# Patient Record
Sex: Female | Born: 1970
Health system: Southern US, Community
[De-identification: ages and names within clinical notes are randomized; demographics above are authoritative.]

## PROBLEM LIST (undated history)

## (undated) DIAGNOSIS — Z8709 Personal history of other diseases of the respiratory system: Secondary | ICD-10-CM

## (undated) DIAGNOSIS — F419 Anxiety disorder, unspecified: Secondary | ICD-10-CM

## (undated) DIAGNOSIS — N946 Dysmenorrhea, unspecified: Secondary | ICD-10-CM

## (undated) DIAGNOSIS — Z8759 Personal history of other complications of pregnancy, childbirth and the puerperium: Secondary | ICD-10-CM

## (undated) HISTORY — PX: BREAST BIOPSY: SHX20

## (undated) HISTORY — DX: Dysmenorrhea, unspecified: N94.6

## (undated) HISTORY — DX: Personal history of other complications of pregnancy, childbirth and the puerperium: Z87.59

## (undated) HISTORY — DX: Anxiety disorder, unspecified: F41.9

## (undated) HISTORY — PX: LIPOMA EXCISION: SHX5283

## (undated) HISTORY — DX: Personal history of other diseases of the respiratory system: Z87.09

---
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
## 1999-10-25 ENCOUNTER — Ambulatory Visit (HOSPITAL_COMMUNITY): Admission: RE | Admit: 1999-10-25 | Discharge: 1999-10-25 | Payer: Self-pay | Admitting: Gastroenterology

## 1999-10-25 ENCOUNTER — Encounter: Payer: Self-pay | Admitting: Gastroenterology

## 2011-09-06 DIAGNOSIS — F3281 Premenstrual dysphoric disorder: Secondary | ICD-10-CM | POA: Insufficient documentation

## 2013-03-17 DIAGNOSIS — Z8679 Personal history of other diseases of the circulatory system: Secondary | ICD-10-CM | POA: Insufficient documentation

## 2013-03-17 DIAGNOSIS — Z8759 Personal history of other complications of pregnancy, childbirth and the puerperium: Secondary | ICD-10-CM | POA: Insufficient documentation

## 2013-03-17 DIAGNOSIS — R002 Palpitations: Secondary | ICD-10-CM | POA: Insufficient documentation

## 2013-03-17 DIAGNOSIS — Z8709 Personal history of other diseases of the respiratory system: Secondary | ICD-10-CM | POA: Insufficient documentation

## 2017-03-29 DIAGNOSIS — Z01419 Encounter for gynecological examination (general) (routine) without abnormal findings: Secondary | ICD-10-CM | POA: Diagnosis not present

## 2017-03-29 DIAGNOSIS — Z1231 Encounter for screening mammogram for malignant neoplasm of breast: Secondary | ICD-10-CM | POA: Diagnosis not present

## 2017-04-17 MED FILL — FLUoxetine HCL 20 MG TABS: 20 | 90 days supply | Qty: 90 | Fill #0

## 2017-04-17 MED FILL — SPIRONOLACTONE 50 MG TAB: 50 | 90 days supply | Qty: 90 | Fill #0

## 2017-06-10 DIAGNOSIS — L700 Acne vulgaris: Secondary | ICD-10-CM | POA: Diagnosis not present

## 2017-06-10 DIAGNOSIS — L821 Other seborrheic keratosis: Secondary | ICD-10-CM | POA: Diagnosis not present

## 2017-06-17 DIAGNOSIS — H524 Presbyopia: Secondary | ICD-10-CM | POA: Diagnosis not present

## 2017-06-17 DIAGNOSIS — H5213 Myopia, bilateral: Secondary | ICD-10-CM | POA: Diagnosis not present

## 2017-06-26 MED FILL — TRETINOIN 0.1% CREAM: 0.1 | 20 days supply | Qty: 45 | Fill #0

## 2017-06-28 MED FILL — SPIRONOLACTONE 50 MG TAB: 50 | 90 days supply | Qty: 90 | Fill #0

## 2017-07-29 MED FILL — TRETINOIN 0.1% CREAM: 0.1 | 20 days supply | Qty: 45 | Fill #1

## 2017-08-26 MED FILL — FLUoxetine HCL 20 MG TABS: 20 | 90 days supply | Qty: 90 | Fill #1

## 2017-09-09 MED FILL — TRETINOIN 0.1% CREAM: 0.1 | 20 days supply | Qty: 45 | Fill #2

## 2017-10-21 MED FILL — TRETINOIN 0.1% CREAM: 0.1 | 20 days supply | Qty: 45 | Fill #3

## 2017-11-04 MED FILL — SPIRONOLACTONE 50 MG TABLET: 50 | 90 days supply | Qty: 90 | Fill #1

## 2017-12-10 ENCOUNTER — Ambulatory Visit (INDEPENDENT_AMBULATORY_CARE_PROVIDER_SITE_OTHER): Payer: 59 | Admitting: Family Medicine

## 2017-12-10 ENCOUNTER — Encounter: Payer: Self-pay | Admitting: Family Medicine

## 2017-12-10 VITALS — BP 104/70 | HR 73 | Temp 98.3°F | Resp 16 | Ht 65.5 in | Wt 135.0 lb

## 2017-12-10 DIAGNOSIS — R05 Cough: Secondary | ICD-10-CM

## 2017-12-10 DIAGNOSIS — R6889 Other general symptoms and signs: Secondary | ICD-10-CM | POA: Diagnosis not present

## 2017-12-10 DIAGNOSIS — J988 Other specified respiratory disorders: Secondary | ICD-10-CM

## 2017-12-10 DIAGNOSIS — R058 Other specified cough: Secondary | ICD-10-CM | POA: Insufficient documentation

## 2017-12-10 DIAGNOSIS — B9789 Other viral agents as the cause of diseases classified elsewhere: Secondary | ICD-10-CM

## 2017-12-10 HISTORY — DX: Other specified cough: R05.8

## 2017-12-10 HISTORY — DX: Other viral agents as the cause of diseases classified elsewhere: B97.89

## 2017-12-10 LAB — POCT INFLUENZA A/B
Influenza A, POC: NEGATIVE
Influenza B, POC: NEGATIVE

## 2017-12-10 MED ORDER — HYDROCODONE-HOMATROPINE 5-1.5 MG/5ML PO SYRP: ORAL_SOLUTION | ORAL | 0 refills | Status: DC

## 2017-12-10 MED ORDER — PREDNISONE 5 MG PO TABS: ORAL_TABLET | ORAL | 0 refills | Status: DC

## 2017-12-10 NOTE — Assessment & Plan Note (Signed)
Acute onset of cough, body aches and chills x 1 week, remains symptomatic, report of green sputum intermittently, no fever by history or on exam Hold on antibiotic, no indication currently, she will need data, as in sputum c/s, CXR and CBC prior to initiation of an antibiotic. We have discussed this and we agree to watch for an additional 48 hrs unless she deteriorates Sick leave x 1 or 2 days

## 2017-12-10 NOTE — Assessment & Plan Note (Addendum)
5 day course of predniosne 5 mg one twice daily, and hycodan 5 cc every 8 hrs as needed for excess cough, prinmarily to be used at bedtime, for sleep Encouraged to start daily claritin which she states she already has, also offer of steroid nasal spray /astelin to be sent to her long term pharmacy, no decision made on that during the visit

## 2017-12-10 NOTE — Progress Notes (Signed)
Katrina Morris     MRN: [id number]      DOB: [birth year]   HPI Katrina Morris / Dr. Mannie Katrina Morris is here for a sick visit. She presents as with a 7 day h/o becoming acutely ill. She  c/o generalized body aches, feels  like the truck ran all over her, malaise, poor appetite, fatigue. Has had chills , but no documented fever. States she  Was 100% well prior to this, and her usual energetic self. She Is concerned about having influenza and is relieved that she has tested  negative for influenza A and B in the office at the visit, she does not want  Tamiflu, and with a negative test, and no fever, I am comfortable with this Primary c/o sore throat, loss of voice and cough, at times chunks of yellow / green sputum, denies sinus pressure or  ear pain . Denies significant nasal congestion, sneezing  or allergy symptoms. Does report cough is worse at night and in early morning,states she does cough throughout the day as well, states she coughed so  much last night her spouse had to go in another room, sh is tired form excess coughing,   disturbing her sleep and feels too ill to work but at the same time , hates to miss work because of being ill, would ather be out to be on vacation! 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 Though no personal h/o of significant allergy symptoms, she does state that her children take claritin daily, and while speaking, she is intermittently unconsciously   sniffing mucus from the back of her nostrils which I am able to point out to her  No mention is made of any of her immediate family members being at home ill at this time She has been using multiple OTC medications for relief , decongestants , and cough suppressant ds for  past 5 days, pushing fluids , and trying to let time be the healer , but feels worse ather than better    ROS See HPI    PE  BP 104/70   Pulse 73   Temp 98.3 F (36.8 C) (Oral)   Resp 16   Ht 5' 5.5" (1.664 m)   Wt 135 lb (61.2 kg)   SpO2 100%   BMI 22.12 kg/m   Patient alert and oriented and in no cardiopulmonary distress.Ill appearing  HEENT: No facial asymmetry, EOMI,   oropharynx pink and moist.  Neck supple no JVD, no mass.no sinu tenderness, TM clear. Oropharynx pink and moist, no exudate, neck supple, no adenopathy  Chest: Clear to auscultation bilaterally.  CVS: S1, S2 no murmurs, no S3.Regular rate.  ABD: Soft non tender.   Ext: No edema  MS: Adequate ROM spine, shoulders, hips and knees.  Skin: Intact, no ulcerations or rash noted.  Psych: Good eye contact, normal affect. Memory intact not anxious or depressed appearing.  CNS: CN 2-12 intact, .no focal deficits noted.   Assessment & Plan  Viral respiratory illness Acute onset of cough, body aches and chills x 1 week, remains symptomatic, report of green sputum intermittently, no fever by history or on exam Hold on antibiotic, no indication currently, she will need data, as  in sputum c/s, CXR and CBC prior to initiation of an antibiotic. We have discussed this and we agree to watch for an additional 48 hrs unless she deteriorates Sick leave x 1 or 2 days  Allergic cough 5 day course of predniosne 5 mg one twice daily, and hycodan 5 cc every 8 hrs as needed for excess cough, prinmarily to be used at bedtime, for sleep Encouraged to start daily claritin which she states she already has, also offer of steroid nasal spray /astelin to be sent to her long term pharmacy, no decision made on that during the visit

## 2017-12-10 NOTE — Patient Instructions (Addendum)
Your flu  test is negative  I think that your symptoms are due primarily from an acute viral lower  respiratory illness and now you are having a post viral cough, which is also aggravated provably  By allergy symptoms  If you develop fever or chills  or get worse in the next 48 hours you NEED to have a CXR , submit sputum for c/s and get a CBC and diff please   I will send in cough suppressant syrup for bedtime use.And I do recommend and have  prednisone 5 mg one twice daily for 5 days  You are having nasal allergy symptoms, pls start steroid nasal spray daily, call in OTC claritin or zyrtec.   Hold on an antibiotic at this time, however as we discussed if need be Z pack  To be started in 48 hrs after baseline data submitted  Please keep in touch

## 2017-12-12 ENCOUNTER — Other Ambulatory Visit: Payer: Self-pay | Admitting: Family Medicine

## 2017-12-12 MED ORDER — BENZONATATE 100 MG PO CAPS: 100.0000 mg | ORAL_CAPSULE | Freq: Three times a day (TID) | ORAL | 0 refills | Status: DC | PRN

## 2017-12-12 MED FILL — FLUoxetine HCL 20 MG TABS: 20 | 90 days supply | Qty: 90 | Fill #2

## 2017-12-16 DIAGNOSIS — D2239 Melanocytic nevi of other parts of face: Secondary | ICD-10-CM | POA: Diagnosis not present

## 2017-12-16 DIAGNOSIS — L738 Other specified follicular disorders: Secondary | ICD-10-CM | POA: Diagnosis not present

## 2018-01-06 ENCOUNTER — Encounter: Payer: Self-pay | Admitting: Obstetrics and Gynecology

## 2018-02-17 ENCOUNTER — Encounter: Payer: Self-pay | Admitting: Obstetrics and Gynecology

## 2018-02-17 ENCOUNTER — Other Ambulatory Visit (HOSPITAL_COMMUNITY)
Admission: RE | Admit: 2018-02-17 | Discharge: 2018-02-17 | Disposition: A | Discharge status: Home / Self Care | Payer: 59 | Source: Ambulatory Visit | Attending: Obstetrics and Gynecology | Admitting: Obstetrics and Gynecology

## 2018-02-17 ENCOUNTER — Ambulatory Visit (INDEPENDENT_AMBULATORY_CARE_PROVIDER_SITE_OTHER): Payer: 59 | Admitting: Obstetrics and Gynecology

## 2018-02-17 ENCOUNTER — Other Ambulatory Visit: Payer: Self-pay

## 2018-02-17 VITALS — BP 112/68 | HR 80 | Resp 18 | Ht 65.0 in | Wt 137.0 lb

## 2018-02-17 DIAGNOSIS — Z0000 Encounter for general adult medical examination without abnormal findings: Secondary | ICD-10-CM | POA: Diagnosis not present

## 2018-02-17 DIAGNOSIS — Z124 Encounter for screening for malignant neoplasm of cervix: Secondary | ICD-10-CM | POA: Insufficient documentation

## 2018-02-17 DIAGNOSIS — E559 Vitamin D deficiency, unspecified: Secondary | ICD-10-CM | POA: Diagnosis not present

## 2018-02-17 DIAGNOSIS — Z1151 Encounter for screening for human papillomavirus (HPV): Secondary | ICD-10-CM | POA: Insufficient documentation

## 2018-02-17 DIAGNOSIS — R635 Abnormal weight gain: Secondary | ICD-10-CM | POA: Diagnosis not present

## 2018-02-17 DIAGNOSIS — Z01419 Encounter for gynecological examination (general) (routine) without abnormal findings: Secondary | ICD-10-CM

## 2018-02-17 DIAGNOSIS — Z1211 Encounter for screening for malignant neoplasm of colon: Secondary | ICD-10-CM | POA: Diagnosis not present

## 2018-02-17 MED ORDER — FLUOXETINE HCL 20 MG PO TABS: 20.0000 mg | ORAL_TABLET | Freq: Every day | ORAL | 3 refills | Status: DC

## 2018-02-17 MED ORDER — SPIRONOLACTONE 50 MG PO TABS: 50.0000 mg | ORAL_TABLET | Freq: Every day | ORAL | 3 refills | Status: DC

## 2018-02-17 MED FILL — SPIRONOLACTONE 50 MG TAB: 50 | 90 days supply | Qty: 90 | Fill #0

## 2018-02-17 NOTE — Patient Instructions (Signed)
EXERCISE AND DIET:  We recommended that you start or continue a regular exercise program for good health. Regular exercise means any activity that makes your heart beat faster and makes you sweat.  We recommend exercising at least 30 minutes per day at least 3 days a week, preferably 4 or 5.  We also recommend a diet low in fat and sugar.  Inactivity, poor dietary choices and obesity can cause diabetes, heart attack, stroke, and kidney damage, among others.    ALCOHOL AND SMOKING:  Women should limit their alcohol intake to no more than 7 drinks/beers/glasses of wine (combined, not each!) per week. Moderation of alcohol intake to this level decreases your risk of breast cancer and liver damage. And of course, no recreational drugs are part of a healthy lifestyle.  And absolutely no smoking or even second hand smoke. Most people know smoking can cause heart and lung diseases, but did you know it also contributes to weakening of your bones? Aging of your skin?  Yellowing of your teeth and nails?  CALCIUM AND VITAMIN D:  Adequate intake of calcium and Vitamin D are recommended.  The recommendations for exact amounts of these supplements seem to change often, but generally speaking 1000 mg of calcium (either carbonate or citrate) and 800 units of Vitamin D per day seems prudent (in diet or supplement). Certain women may benefit from higher intake of Vitamin D.  If you are among these women, your doctor will have told you during your visit.    PAP SMEARS:  Pap smears, to check for cervical cancer or precancers,  have traditionally been done yearly, although recent scientific advances have shown that most women can have pap smears less often.  However, every woman still should have a physical exam from her gynecologist every year. It will include a breast check, inspection of the vulva and vagina to check for abnormal growths or skin changes, a visual exam of the cervix, and then an exam to evaluate the size and  shape of the uterus and ovaries.  And after 46 years of age, a rectal exam is indicated to check for rectal cancers. We will also provide age appropriate advice regarding health maintenance, like when you should have certain vaccines, screening for sexually transmitted diseases, bone density testing, colonoscopy, mammograms, etc.   MAMMOGRAMS:  All women over 61 years old should have a yearly mammogram. Many facilities now offer a "3D" mammogram, which may cost around $50 extra out of pocket. If possible,  we recommend you accept the option to have the 3D mammogram performed.  It both reduces the number of women who will be called back for extra views which then turn out to be normal, and it is better than the routine mammogram at detecting truly abnormal areas.    COLONOSCOPY:  Colonoscopy to screen for colon cancer is recommended for all women at age 1.  We know, you hate the idea of the prep.  We agree, BUT, having colon cancer and not knowing it is worse!!  Colon cancer so often starts as a polyp that can be seen and removed at colonscopy, which can quite literally save your life!  And if your first colonoscopy is normal and you have no family history of colon cancer, most women don't have to have it again for 10 years.  Once every ten years, you can do something that may end up saving your life, right?  We will be happy to help you get it scheduled  when you are ready.  Be sure to check your insurance coverage so you understand how much it will cost.  It may be covered as a preventative service at no cost, but you should check your particular policy.      Breast Self-Awareness Breast self-awareness means being familiar with how your breasts look and feel. It involves checking your breasts regularly and reporting any changes to your health care provider. Practicing breast self-awareness is important. A change in your breasts can be a sign of a serious medical problem. Being familiar with how your  breasts look and feel allows you to find any problems early, when treatment is more likely to be successful. All women should practice breast self-awareness, including women who have had breast implants. How to do a breast self-exam One way to learn what is normal for your breasts and whether your breasts are changing is to do a breast self-exam. To do a breast self-exam: Look for Changes  1. Remove all the clothing above your waist. 2. Stand in front of a mirror in a room with good lighting. 3. Put your hands on your hips. 4. Push your hands firmly downward. 5. Compare your breasts in the mirror. Look for differences between them (asymmetry), such as: ? Differences in shape. ? Differences in size. ? Puckers, dips, and bumps in one breast and not the other. 6. Look at each breast for changes in your skin, such as: ? Redness. ? Scaly areas. 7. Look for changes in your nipples, such as: ? Discharge. ? Bleeding. ? Dimpling. ? Redness. ? A change in position. Feel for Changes  Carefully feel your breasts for lumps and changes. It is best to do this while lying on your back on the floor and again while sitting or standing in the shower or tub with soapy water on your skin. Feel each breast in the following way:  Place the arm on the side of the breast you are examining above your head.  Feel your breast with the other hand.  Start in the nipple area and make  inch (2 cm) overlapping circles to feel your breast. Use the pads of your three middle fingers to do this. Apply light pressure, then medium pressure, then firm pressure. The light pressure will allow you to feel the tissue closest to the skin. The medium pressure will allow you to feel the tissue that is a little deeper. The firm pressure will allow you to feel the tissue close to the ribs.  Continue the overlapping circles, moving downward over the breast until you feel your ribs below your breast.  Move one finger-width toward  the center of the body. Continue to use the  inch (2 cm) overlapping circles to feel your breast as you move slowly up toward your collarbone.  Continue the up and down exam using all three pressures until you reach your armpit.  Write Down What You Find  Write down what is normal for each breast and any changes that you find. Keep a written record with breast changes or normal findings for each breast. By writing this information down, you do not need to depend only on memory for size, tenderness, or location. Write down where you are in your menstrual cycle, if you are still menstruating. If you are having trouble noticing differences in your breasts, do not get discouraged. With time you will become more familiar with the variations in your breasts and more comfortable with the exam. How often should  I examine my breasts? Examine your breasts every month. If you are breastfeeding, the best time to examine your breasts is after a feeding or after using a breast pump. If you menstruate, the best time to examine your breasts is 5-7 days after your period is over. During your period, your breasts are lumpier, and it may be more difficult to notice changes. When should I see my health care provider? See your health care provider if you notice:  A change in shape or size of your breasts or nipples.  A change in the skin of your breast or nipples, such as a reddened or scaly area.  Unusual discharge from your nipples.  A lump or thick area that was not there before.  Pain in your breasts.  Anything that concerns you.  This information is not intended to replace advice given to you by your health care provider. Make sure you discuss any questions you have with your health care provider. Document Released: 08/27/2005 Document Revised: 02/02/2016 Document Reviewed: 07/17/2015 Elsevier Interactive Patient Education  [employer].

## 2018-02-17 NOTE — Progress Notes (Signed)
47 y.o. [id number] MarriedCaucasianF here for annual exam. She had one skipped cycle. Typically cycles are q month. No vasomotor symptoms or vaginal dryness. No dyspareunia    Period Duration (Days): 7 days  Period Pattern: Regular Menstrual Flow: Moderate Menstrual Control: Maxi pad, Tampon Menstrual Control Change Freq (Hours): changes pad or tampon every 3 hours  Dysmenorrhea: (!) Moderate Dysmenorrhea Symptoms: Cramping  Patient's last menstrual period was 02/16/2018.          Sexually active: Yes.    The current method of family planning is vasectomy.    Exercising: Yes.    walking/ running/swimming  Smoker:  no  Health Maintenance: Pap:  2014  History of abnormal Pap:  Yes 1990s -colposcopy- neg  MMG:  2013 WNL  Colonoscopy:  Never BMD:   N/A TDaP:  07-29-13 Gardasil: N/A   reports that she has never smoked. She has never used smokeless tobacco. She reports that she drinks about 0.6 - 1.2 oz of alcohol per week. She reports that she does not use drugs. 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 Twins are 73, girls.   Past Medical History:  Diagnosis Date  . Anxiety   . Dysmenorrhea   . History of pre-eclampsia   She is on Spironolactone for acne, given by her dermatologist.   Past Surgical History:  Procedure Laterality Date  . CESAREAN SECTION    . COLPOSCOPY  1990   NEG     Current Outpatient Medications  Medication Sig Dispense Refill  . FLUoxetine (PROZAC) 20 MG tablet Take by mouth.    . spironolactone (ALDACTONE) 50 MG tablet Take 1 tablet (50 mg total) by mouth daily.    Marland Kitchen tretinoin (RETIN-A) 0.1 % cream Apply topically at bedtime. 45 g 0   No current facility-administered medications for this visit.     Family History  Problem Relation Age of Onset  . Uterine cancer Paternal Grandmother     Review of Systems  Constitutional: Negative.   HENT: Negative.   Eyes: Negative.   Respiratory: Negative.   Cardiovascular: Negative.   Gastrointestinal:  Negative.   Endocrine: Negative.   Genitourinary: Negative.   Musculoskeletal: Negative.   Skin: Negative.   Allergic/Immunologic: Negative.   Neurological: Negative.   Psychiatric/Behavioral: Negative.     Exam:   BP 112/68 (BP Location: Left Arm, Patient Position: Sitting, Cuff Size: Normal)   Pulse 80   Resp 18   Ht 5\' 5"  (1.651 m)   Wt 137 lb (62.1 kg)   LMP 02/16/2018   BMI 22.80 kg/m   Weight change: @WEIGHTCHANGE @ Height:   Height: 5\' 5"  (165.1 cm)  Ht Readings from Last 3 Encounters:  02/17/18 5\' 5"  (1.651 m)  12/10/17 5' 5.5" (1.664 m)    General appearance: alert, cooperative and appears stated age Head: Normocephalic, without obvious abnormality, atraumatic Neck: no adenopathy, supple, symmetrical, trachea midline and thyroid normal to inspection and palpation Lungs: clear to auscultation bilaterally Cardiovascular: regular rate and rhythm Breasts: normal appearance, no masses or tenderness Abdomen: soft, non-tender; non distended,  no masses,  no organomegaly Extremities: extremities normal, atraumatic, no cyanosis or edema Skin: Skin color, texture, turgor normal. No rashes or lesions Lymph nodes: Cervical, supraclavicular, and axillary nodes normal. No abnormal inguinal nodes palpated Neurologic: Grossly normal   Pelvic: External genitalia:  no lesions              Urethra:  normal appearing urethra with no masses, tenderness or lesions  Bartholins and Skenes: normal                 Vagina: normal appearing vagina with normal color and discharge, no lesions. Blood in vagina, on cycle              Cervix: no lesions               Bimanual Exam:  Uterus:  normal size, contour, position, consistency, mobility, non-tender              Adnexa: no mass, fullness, tenderness               Rectovaginal: Confirms               Anus:  normal sphincter tone, no lesions  Chaperone was present for exam.  A:  Well Woman with normal exam  Weight gain,  still normal BMI   One late cycle, no vasomotor symptoms    P:   Spironolactone for acne  Prozac for mood/OCD  Screening labs  Pap with hpv  Mammogram  Discussed breast self exam  Discussed calcium and vit D intake  Call if she goes 2 months without a cycle, will call in cyclic provera  IFOB given

## 2018-02-18 LAB — LIPID PANEL
Chol/HDL Ratio: 4.1 ratio (ref 0.0–4.4)
Cholesterol, Total: 205 mg/dL — ABNORMAL HIGH (ref 100–199)
HDL: 50 mg/dL (ref >39)
LDL Calculated: 104 mg/dL — ABNORMAL HIGH (ref 0–99)
Triglycerides: 255 mg/dL — ABNORMAL HIGH (ref 0–149)
VLDL Cholesterol Cal: 51 mg/dL — ABNORMAL HIGH (ref 5–40)

## 2018-02-18 LAB — COMPREHENSIVE METABOLIC PANEL
ALT: 14 IU/L (ref 0–32)
AST: 17 IU/L (ref 0–40)
Albumin/Globulin Ratio: 1.8 (ref 1.2–2.2)
Albumin: 4.3 g/dL (ref 3.5–5.5)
Alkaline Phosphatase: 62 IU/L (ref 39–117)
BUN/Creatinine Ratio: 22 (ref 9–23)
BUN: 16 mg/dL (ref 6–24)
Bilirubin Total: 0.6 mg/dL (ref 0.0–1.2)
CO2: 21 mmol/L (ref 20–29)
Calcium: 9.3 mg/dL (ref 8.7–10.2)
Chloride: 104 mmol/L (ref 96–106)
Creatinine, Ser: 0.74 mg/dL (ref 0.57–1.00)
GFR calc Af Amer: 112 mL/min/{1.73_m2} (ref >59)
GFR calc non Af Amer: 97 mL/min/{1.73_m2} (ref >59)
Globulin, Total: 2.4 g/dL (ref 1.5–4.5)
Glucose: 78 mg/dL (ref 65–99)
Potassium: 3.8 mmol/L (ref 3.5–5.2)
Sodium: 142 mmol/L (ref 134–144)
Total Protein: 6.7 g/dL (ref 6.0–8.5)

## 2018-02-18 LAB — HEMOGLOBIN A1C

## 2018-02-18 LAB — TSH: TSH: 0.83 u[IU]/mL (ref 0.450–4.500)

## 2018-02-18 LAB — VITAMIN D 25 HYDROXY (VIT D DEFICIENCY, FRACTURES): Vit D, 25-Hydroxy: 20.5 ng/mL — ABNORMAL LOW (ref 30.0–100.0)

## 2018-02-20 ENCOUNTER — Telehealth: Payer: Self-pay | Admitting: *Deleted

## 2018-02-20 LAB — CYTOLOGY - PAP
Diagnosis: NEGATIVE
HPV: NOT DETECTED

## 2018-02-20 NOTE — Telephone Encounter (Signed)
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 Why is the CBC pending?  Let her know her glucose was 78, normal CMP, normal TSH. Let her know her lipid panel results, this was not a fasting level she had a fasting level w/in the year. Can she let us know those results? Her vit D was low, she should start taking 1,000 IU of vit d 3 daily (long term)   CBC, pap are pending.

## 2018-02-20 NOTE — Telephone Encounter (Signed)
Patient returning call to [facility].

## 2018-02-20 NOTE — Telephone Encounter (Signed)
Left message to call regarding results -eh 

## 2018-02-20 NOTE — Telephone Encounter (Signed)
Spoke with patient and went over lab results in detail. I advised patient that there was an error with [facility] and the blood draw sample and they were unable to run both the CBC and the HgbA1C. Patient stated that she did not want to repeat these labs at this time. She will have these drawn at another time if she decides wants to. -eh

## 2018-03-10 DIAGNOSIS — D229 Melanocytic nevi, unspecified: Secondary | ICD-10-CM | POA: Diagnosis not present

## 2018-03-10 DIAGNOSIS — L821 Other seborrheic keratosis: Secondary | ICD-10-CM | POA: Diagnosis not present

## 2018-04-02 MED FILL — FLUoxetine HCL 20 MG TABS: 20 | 30 days supply | Qty: 30 | Fill #0

## 2018-04-17 ENCOUNTER — Other Ambulatory Visit: Payer: Self-pay | Admitting: Family Medicine

## 2018-04-17 DIAGNOSIS — Z1231 Encounter for screening mammogram for malignant neoplasm of breast: Secondary | ICD-10-CM

## 2018-04-18 ENCOUNTER — Ambulatory Visit (HOSPITAL_COMMUNITY)
Admission: RE | Admit: 2018-04-18 | Discharge: 2018-04-18 | Disposition: A | Discharge status: Home / Self Care | Payer: 59 | Source: Ambulatory Visit | Attending: Obstetrics and Gynecology | Admitting: Obstetrics and Gynecology

## 2018-04-18 ENCOUNTER — Other Ambulatory Visit: Payer: Self-pay | Admitting: Obstetrics and Gynecology

## 2018-04-18 DIAGNOSIS — Z1231 Encounter for screening mammogram for malignant neoplasm of breast: Secondary | ICD-10-CM

## 2018-04-18 DIAGNOSIS — R928 Other abnormal and inconclusive findings on diagnostic imaging of breast: Secondary | ICD-10-CM

## 2018-04-22 ENCOUNTER — Ambulatory Visit (HOSPITAL_COMMUNITY)
Admission: RE | Admit: 2018-04-22 | Discharge: 2018-04-22 | Disposition: A | Discharge status: Home / Self Care | Payer: 59 | Source: Ambulatory Visit | Attending: Obstetrics and Gynecology | Admitting: Obstetrics and Gynecology

## 2018-04-22 ENCOUNTER — Other Ambulatory Visit: Payer: Self-pay | Admitting: Obstetrics and Gynecology

## 2018-04-22 DIAGNOSIS — R928 Other abnormal and inconclusive findings on diagnostic imaging of breast: Secondary | ICD-10-CM | POA: Insufficient documentation

## 2018-04-22 DIAGNOSIS — R922 Inconclusive mammogram: Secondary | ICD-10-CM | POA: Diagnosis not present

## 2018-04-22 DIAGNOSIS — N63 Unspecified lump in unspecified breast: Secondary | ICD-10-CM

## 2018-04-22 DIAGNOSIS — N6311 Unspecified lump in the right breast, upper outer quadrant: Secondary | ICD-10-CM | POA: Diagnosis not present

## 2018-05-05 ENCOUNTER — Ambulatory Visit (INDEPENDENT_AMBULATORY_CARE_PROVIDER_SITE_OTHER): Payer: 59 | Admitting: Orthopedic Surgery

## 2018-05-05 ENCOUNTER — Ambulatory Visit
Admission: RE | Admit: 2018-05-05 | Discharge: 2018-05-05 | Disposition: A | Discharge status: Home / Self Care | Payer: 59 | Source: Ambulatory Visit | Attending: Obstetrics and Gynecology | Admitting: Obstetrics and Gynecology

## 2018-05-05 DIAGNOSIS — D241 Benign neoplasm of right breast: Secondary | ICD-10-CM | POA: Diagnosis not present

## 2018-05-05 DIAGNOSIS — N6311 Unspecified lump in the right breast, upper outer quadrant: Secondary | ICD-10-CM | POA: Diagnosis not present

## 2018-05-05 DIAGNOSIS — N63 Unspecified lump in unspecified breast: Secondary | ICD-10-CM

## 2018-05-07 MED FILL — FLUoxetine HCL 20 MG TABS: 20 | 90 days supply | Qty: 90 | Fill #1

## 2018-05-30 ENCOUNTER — Telehealth: Payer: Self-pay | Admitting: Family Medicine

## 2018-05-30 MED ORDER — MINOCYCLINE HCL 100 MG PO CAPS: 100.0000 mg | ORAL_CAPSULE | Freq: Two times a day (BID) | ORAL | 0 refills | Status: DC

## 2018-05-30 NOTE — Telephone Encounter (Signed)
C/o skin lesion in nostril which she needs antibiotic treatment which has been useful in the past , minocycline, Occurs sporadiacalyl, discussed and will call in sufficent to last approx 6 months. Uses acne treatment daily

## 2018-08-26 MED FILL — SPIRONOLACTONE 50 MG TABLET: 50 | 90 days supply | Qty: 90 | Fill #1

## 2018-08-26 MED FILL — FLUoxetine HCL 20 MG TABS: 20 | 90 days supply | Qty: 90 | Fill #2

## 2018-12-31 ENCOUNTER — Institutional Professional Consult (permissible substitution): Payer: 59 | Admitting: Plastic Surgery

## 2019-01-08 ENCOUNTER — Other Ambulatory Visit: Payer: Self-pay

## 2019-01-08 ENCOUNTER — Encounter: Payer: Self-pay | Admitting: Plastic Surgery

## 2019-01-08 ENCOUNTER — Ambulatory Visit (INDEPENDENT_AMBULATORY_CARE_PROVIDER_SITE_OTHER): Payer: No Typology Code available for payment source | Admitting: Plastic Surgery

## 2019-01-08 DIAGNOSIS — R2242 Localized swelling, mass and lump, left lower limb: Secondary | ICD-10-CM

## 2019-01-08 HISTORY — DX: Localized swelling, mass and lump, left lower limb: R22.42

## 2019-01-08 NOTE — Progress Notes (Signed)
     Patient ID: Katrina Morris, female    DOB: [birth year], 48 y.o.   MRN: [id number]   Chief Complaint  Patient presents with  . Skin Problem    The patient is a 48 year old female.  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  Her husband is a physician with urgent care.  She is here for evaluation of a mass on her medial left thigh.  The patient noticed at least a year ago the area.  It is 2 x 2 cm, soft, slightly movable and slightly tender.  Nothing seems to make it better and it seems to be getting larger in time.  She does not have any history of skin cancer or similar lesions.  She is otherwise in excellent health.   Review of Systems  Constitutional: Negative.  Negative for activity change and appetite change.  HENT: Negative.   Eyes: Negative.   Respiratory: Negative.  Negative for chest tightness and shortness of breath.   Cardiovascular: Negative.   Gastrointestinal: Negative.   Genitourinary: Negative.   Musculoskeletal: Negative.   Hematological: Negative.   Psychiatric/Behavioral: Negative.     Past Medical History:  Diagnosis Date  . Anxiety   . Dysmenorrhea   . History of pre-eclampsia   . History of pulmonary edema     Past Surgical History:  Procedure Laterality Date  . CESAREAN SECTION    . COLPOSCOPY  1990   NEG       Current Outpatient Medications:  .  FLUoxetine (PROZAC) 20 MG tablet, Take 1 tablet (20 mg total) by mouth daily., Disp: 90 tablet, Rfl: 3 .  minocycline (MINOCIN,DYNACIN) 100 MG capsule, Take 1 capsule (100 mg total) by mouth 2 (two) times daily., Disp: 60 capsule, Rfl: 0 .  spironolactone (ALDACTONE) 50 MG tablet, Take 1 tablet (50 mg total) by mouth daily., Disp: 90 tablet, Rfl: 3 .  tretinoin (RETIN-A) 0.1 % cream, Apply topically at bedtime., Disp: 45 g, Rfl: 0   Objective:   Vitals:   01/08/19 1141  BP: 112/71  Pulse: 79  Temp: 97.7 F (36.5 C)  SpO2: 100%    Physical Exam Vitals signs and nursing note reviewed.   Constitutional:      Appearance: Normal appearance.  HENT:     Head: Normocephalic and atraumatic.  Cardiovascular:     Rate and Rhythm: Normal rate.  Pulmonary:     Effort: Pulmonary effort is normal.  Abdominal:     General: Abdomen is flat.  Musculoskeletal:       Legs:  Skin:    General: Skin is warm.  Neurological:     General: No focal deficit present.     Mental Status: She is alert and oriented to person, place, and time.  Psychiatric:        Mood and Affect: Mood normal.        Behavior: Behavior normal.     Assessment & Plan:  Mass of left thigh  Recommend excision of left thigh mass in the office. Pictures taken and placed in the chart with the patient permission. We discussed risks and complications including the possibility of further excision depending on the pathology.  [place], DO

## 2019-01-09 ENCOUNTER — Institutional Professional Consult (permissible substitution): Payer: Self-pay | Admitting: Plastic Surgery

## 2019-01-27 ENCOUNTER — Ambulatory Visit (INDEPENDENT_AMBULATORY_CARE_PROVIDER_SITE_OTHER): Payer: No Typology Code available for payment source | Admitting: Plastic Surgery

## 2019-01-27 ENCOUNTER — Other Ambulatory Visit (HOSPITAL_COMMUNITY)
Admission: RE | Admit: 2019-01-27 | Discharge: 2019-01-27 | Disposition: A | Discharge status: Home / Self Care | Payer: No Typology Code available for payment source | Source: Ambulatory Visit | Attending: Plastic Surgery | Admitting: Plastic Surgery

## 2019-01-27 ENCOUNTER — Other Ambulatory Visit: Payer: Self-pay

## 2019-01-27 ENCOUNTER — Encounter: Payer: Self-pay | Admitting: Plastic Surgery

## 2019-01-27 VITALS — BP 114/69 | HR 73 | Temp 97.5°F | Ht 66.0 in | Wt 134.2 lb

## 2019-01-27 DIAGNOSIS — R2242 Localized swelling, mass and lump, left lower limb: Secondary | ICD-10-CM | POA: Diagnosis present

## 2019-01-27 DIAGNOSIS — Z719 Counseling, unspecified: Secondary | ICD-10-CM

## 2019-01-27 NOTE — Progress Notes (Signed)
Preoperative Dx: lipoma of left inner thigh  Postoperative Dx: Same  Procedure: excision of lipoma of left inner thigh 4 cm  Surgeon: Dr. Lyndee Leo Quinntin Malter  Anesthesia: Lidocaine 1% with 1:100,000 epinepherine  Indication for Procedure: lipoma  Description of Procedure: Risks and complications were explained to the patient.  Consent was confirmed.  All information was confirmed to be correct.  The area was prepped with betadine and drapped.  Lidocaine 1% with epinepherine was injected in the subcutaneous area.  After waiting several minutes for the lidocaine to take affect a #15 blade was used to incise the skin over the area.  The tissue scissors were used to dissect to the lesion and remove all that was noted to be a part of the lipoma.  This was ~ 4 cm in size.  A 6-0 Monocryl was used to close the skin edges with vertical mattress sutures.   The patient is to follow up in two weeks.  She tolerated the procedure well and there were no complications. The specimen was sent to pathology.

## 2019-01-27 NOTE — Progress Notes (Signed)
Botulinum Toxin Injection Procedure Note  Procedure: Cosmetic botulinum toxin  Pre-operative Diagnosis: Dynamic rhytides   Post-operative Diagnosis: Same  Complications:  None  Brief history: The patient desires botulinum toxin injection of her forehead. I discussed with the patient this proposed procedure of botulinum toxin injections, which is customized depending on the particular needs of the patient. It is performed on facial rhytids as a temporary correction. The alternatives were discussed with the patient. The risks were addressed including bleeding, scarring, infection, damage to deeper structures, asymmetry, and chronic pain, which may occur infrequently after a procedure. The individual's choice to undergo a surgical procedure is based on the comparison of risks to potential benefits. Other risks include unsatisfactory results, brow ptosis, eyelid ptosis, allergic reaction, temporary paralysis, which should go away with time, bruising, blurring disturbances and delayed healing. Botulinum toxin injections do not arrest the aging process or produce permanent tightening of the eyelid.  Operative intervention maybe necessary to maintain the results of a blepharoplasty or botulinum toxin. The patient understands and wishes to proceed. An informed consent was signed and informational brochures given to her prior to the procedure.  Procedure: The area was prepped with alcohol and dried with a clean gauze. Using a clean technique, the botulinum toxin was diluted with 1.25 cc of preservative-free normal saline which was slowly injected with an 18 gauge needle in a tuberculin syringes.  A 32 gauge needles were then used to inject the botulinum toxin. This mixture allow for an aliquot of 5 units per 0.1 cc in each injection site.    Subsequently the mixture was injected in the glabellar and forehead area with preservation of the temporal branch to the lateral eyebrow as well as into each lateral  canthal area beginning from the lateral orbital rim medial to the zygomaticus major in 3 separate areas. A total of 20 Units of botulinum toxin was used. The forehead and glabellar area was injected with care to inject intramuscular only while holding pressure on the supratrochlear vessels in each area during each injection on either side of the medial corrugators. The injection proceeded vertically superiorly to the medial 2/3 of the frontalis muscle and superior 2/3 of the lateral frontalis, again with preservation of the frontal branch.  No complications were noted. Light pressure was held for 5 minutes. She was instructed explicitly in post-operative care.  Botox LOT:  [id number] C2 EXP:  6/22

## 2019-01-27 NOTE — Addendum Note (Signed)
Addended by: Wallace Going on: 01/27/2019 03:06 PM   Modules accepted: Orders

## 2019-02-03 ENCOUNTER — Telehealth: Payer: Self-pay

## 2019-02-03 NOTE — Telephone Encounter (Signed)
01/30/19- call to pt to inform her that the pathology report has returned from her thigh lesion excision & per Dr. Marla Roe- it was a lipoma No answer from the pt- but I left a message  [place]

## 2019-02-06 ENCOUNTER — Telehealth: Payer: Self-pay | Admitting: Plastic Surgery

## 2019-02-06 NOTE — Telephone Encounter (Signed)
Call back to pt in re: her incision/sutures from thigh lesion removal She wanted to know if the sutures are absorbable I left a v/m -per pt request that the sutures are absorbable, but she did indicate that her husband who is a MD- could take them out I did advise her to call if any concerns or complications Katrina Morris

## 2019-02-06 NOTE — Telephone Encounter (Signed)
Received call from patient requesting call back from [place], [place]. She would like to know what type of sutures were used. Patient stated ok to leave voicemail when calling back.

## 2019-02-13 ENCOUNTER — Other Ambulatory Visit: Payer: Self-pay | Admitting: Obstetrics and Gynecology

## 2019-02-13 NOTE — Telephone Encounter (Signed)
Patient is in need of refill on Fluoxetine [facility] [address] in [place].

## 2019-02-13 NOTE — Telephone Encounter (Signed)
Patient would like tablets instead of capsules.

## 2019-02-13 NOTE — Telephone Encounter (Signed)
Medication refill request: fluoxetine Last AEX:  02-17-18 Next AEX: 04-22-2019 Last MMG (if hormonal medication request): n/a Refill authorized: please approve if appropriate

## 2019-02-16 MED ORDER — FLUOXETINE HCL 20 MG PO TABS: 20.0000 mg | ORAL_TABLET | Freq: Every day | ORAL | 2 refills | Status: DC

## 2019-04-15 ENCOUNTER — Other Ambulatory Visit: Payer: Self-pay | Admitting: Obstetrics and Gynecology

## 2019-04-15 MED ORDER — SPIRONOLACTONE 25 MG PO TABS: 25.0000 mg | ORAL_TABLET | Freq: Every day | ORAL | 0 refills | Status: DC

## 2019-04-15 NOTE — Telephone Encounter (Signed)
Patient is calling requesting a refill request for spironolactone 25 MG. Patient confirmed pharmacy as [facility] [address] in [place], [place].

## 2019-04-15 NOTE — Telephone Encounter (Signed)
Medication refill request: spironolactone 25mg  Last AEX:  02-17-18 Next AEX: 04-22-2019 Last MMG (if hormonal medication request): n/a Refill authorized: please approve if appropriate

## 2019-04-20 ENCOUNTER — Ambulatory Visit: Payer: Self-pay | Admitting: Obstetrics and Gynecology

## 2019-04-20 ENCOUNTER — Other Ambulatory Visit: Payer: Self-pay

## 2019-04-21 NOTE — Progress Notes (Signed)
48 y.o. G33P1002 Married White or Caucasian Not Hispanic or Latino female here for annual exam.   She is having hot flashes, not sleeping through the night. Up at least 3 x a night with sweats. Worse at night. Tired during the day. She eats healthy, exercises.  Cycles were monthly until May, nothing since then. Sexually active, no pain.  She dribbles urine after voiding.     Patient's last menstrual period was 01/18/2019 (approximate).          Sexually active: Yes.    The current method of family planning is vasectomy.    Exercising: Yes.    run, strength training  Smoker:  no  Health Maintenance: Pap:  02/17/2018 WNL NEG HPV History of abnormal Pap:  Yes 1990s -colposcopy- neg  MMG:  04/22/2018 Birads 4 suspicious, 05/05/2018 biopsy right breast showed fibroadenoma Colonoscopy:  Never BMD:   N/A TDaP:  07-29-13 Gardasil: N/A   reports that she has never smoked. She has never used smokeless tobacco. She reports current alcohol use. She reports that she does not use drugs. 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 Twins are girls are 62.  Past Medical History:  Diagnosis Date  . Anxiety   . Dysmenorrhea   . History of pre-eclampsia   . History of pulmonary edema     Past Surgical History:  Procedure Laterality Date  . CESAREAN SECTION    . COLPOSCOPY  1990   NEG   . LIPOMA EXCISION     inner leg     Current Outpatient Medications  Medication Sig Dispense Refill  . FLUoxetine (PROZAC) 20 MG tablet Take 1 tablet (20 mg total) by mouth daily. 30 tablet 2  . spironolactone (ALDACTONE) 25 MG tablet Take 1 tablet (25 mg total) by mouth daily. 30 tablet 0  . tretinoin (RETIN-A) 0.1 % cream Apply topically at bedtime. 45 g 0   No current facility-administered medications for this visit.   On Spironolactone for acne, given by her dermatologist.   Family History  Problem Relation Age of Onset  . Uterine cancer Paternal Grandmother   . Ovarian cysts Mother        Mucinous  cysadenoma ovary     Review of Systems  Constitutional: Negative.   HENT: Negative.   Eyes: Negative.   Respiratory: Negative.   Cardiovascular: Negative.   Gastrointestinal: Negative.   Endocrine: Positive for cold intolerance and heat intolerance.  Genitourinary: Positive for menstrual problem.  Musculoskeletal: Negative.   Skin: Negative.   Allergic/Immunologic: Negative.   Neurological: Negative.   Hematological: Negative.   Psychiatric/Behavioral: Negative.     Exam:   BP 120/82   Pulse 76   Temp 97.9 F (36.6 C) (Temporal)   Ht 5' 5.5" (1.664 m)   Wt 117 lb (53.1 kg)   LMP 01/18/2019 (Approximate)   BMI 19.17 kg/m   Weight change: @WEIGHTCHANGE @ Height:   Height: 5' 5.5" (166.4 cm)  Ht Readings from Last 3 Encounters:  04/22/19 5' 5.5" (1.664 m)  01/27/19 5\' 6"  (1.676 m)  01/08/19 5' 5.5" (1.664 m)    General appearance: alert, cooperative and appears stated age Head: Normocephalic, without obvious abnormality, atraumatic Neck: no adenopathy, supple, symmetrical, trachea midline and thyroid normal to inspection and palpation Lungs: clear to auscultation bilaterally Cardiovascular: regular rate and rhythm Breasts: normal appearance, no masses or tenderness Abdomen: soft, non-tender; non distended,  no masses,  no organomegaly Extremities: extremities normal, atraumatic, no cyanosis or edema Skin: Skin color,  texture, turgor normal. No rashes or lesions Lymph nodes: Cervical, supraclavicular, and axillary nodes normal. No abnormal inguinal nodes palpated Neurologic: Grossly normal   Pelvic: External genitalia:  no lesions              Urethra:  normal appearing urethra with no masses, tenderness or lesions              Bartholins and Skenes: normal                 Vagina: normal appearing vagina with normal color and discharge, no lesions. No significant prolapse or atrophy.              Cervix: no lesions               Bimanual Exam:  Uterus:  normal size,  contour, position, consistency, mobility, non-tender              Adnexa: no mass, fullness, tenderness               Rectovaginal: Confirms               Anus:  normal sphincter tone, no lesions  Chaperone was present for exam.  A:  Well Woman with normal exam  Perimenopausal, recent amenorrhea  Vasomotor symptoms.   P:   No pap this year  Screening labs  Discussed breast self exam  Discussed calcium and vit D intake  Provera W/D  Start low dose OCP's on first day of her cycles. No contraindication, aware of risks  IFOB

## 2019-04-22 ENCOUNTER — Encounter: Payer: Self-pay | Admitting: Obstetrics and Gynecology

## 2019-04-22 ENCOUNTER — Ambulatory Visit (INDEPENDENT_AMBULATORY_CARE_PROVIDER_SITE_OTHER): Payer: No Typology Code available for payment source | Admitting: Obstetrics and Gynecology

## 2019-04-22 ENCOUNTER — Other Ambulatory Visit: Payer: Self-pay

## 2019-04-22 VITALS — BP 120/82 | HR 76 | Temp 97.9°F | Ht 65.5 in | Wt 117.0 lb

## 2019-04-22 DIAGNOSIS — Z01419 Encounter for gynecological examination (general) (routine) without abnormal findings: Secondary | ICD-10-CM

## 2019-04-22 DIAGNOSIS — N912 Amenorrhea, unspecified: Secondary | ICD-10-CM

## 2019-04-22 DIAGNOSIS — E785 Hyperlipidemia, unspecified: Secondary | ICD-10-CM

## 2019-04-22 DIAGNOSIS — Z1211 Encounter for screening for malignant neoplasm of colon: Secondary | ICD-10-CM | POA: Diagnosis not present

## 2019-04-22 DIAGNOSIS — N951 Menopausal and female climacteric states: Secondary | ICD-10-CM

## 2019-04-22 DIAGNOSIS — Z0000 Encounter for general adult medical examination without abnormal findings: Secondary | ICD-10-CM

## 2019-04-22 MED ORDER — FLUOXETINE HCL 20 MG PO TABS: 20.0000 mg | ORAL_TABLET | Freq: Every day | ORAL | 4 refills | Status: DC

## 2019-04-22 MED ORDER — MEDROXYPROGESTERONE ACETATE 5 MG PO TABS: 5.0000 mg | ORAL_TABLET | Freq: Every day | ORAL | 0 refills | Status: DC

## 2019-04-22 MED ORDER — LEVONORGEST-ETH ESTRAD 91-DAY 0.1-0.02 & 0.01 MG PO TABS: 1.0000 | ORAL_TABLET | Freq: Every day | ORAL | 4 refills | Status: DC

## 2019-04-22 NOTE — Patient Instructions (Signed)
EXERCISE AND DIET:  We recommended that you start or continue a regular exercise program for good health. Regular exercise means any activity that makes your heart beat faster and makes you sweat.  We recommend exercising at least 30 minutes per day at least 3 days a week, preferably 4 or 5.  We also recommend a diet low in fat and sugar.  Inactivity, poor dietary choices and obesity can cause diabetes, heart attack, stroke, and kidney damage, among others.    ALCOHOL AND SMOKING:  Women should limit their alcohol intake to no more than 7 drinks/beers/glasses of wine (combined, not each!) per week. Moderation of alcohol intake to this level decreases your risk of breast cancer and liver damage. And of course, no recreational drugs are part of a healthy lifestyle.  And absolutely no smoking or even second hand smoke. Most people know smoking can cause heart and lung diseases, but did you know it also contributes to weakening of your bones? Aging of your skin?  Yellowing of your teeth and nails?  CALCIUM AND VITAMIN D:  Adequate intake of calcium and Vitamin D are recommended.  The recommendations for exact amounts of these supplements seem to change often, but generally speaking 1,000 mg of calcium (between diet and supplement) and 800 units of Vitamin D per day seems prudent. Certain women may benefit from higher intake of Vitamin D.  If you are among these women, your doctor will have told you during your visit.    PAP SMEARS:  Pap smears, to check for cervical cancer or precancers,  have traditionally been done yearly, although recent scientific advances have shown that most women can have pap smears less often.  However, every woman still should have a physical exam from her gynecologist 48 years. It will include a breast check, inspection of the vulva and vagina to check for abnormal growths or skin changes, a visual exam of the cervix, and then an exam to evaluate the size and shape of the uterus and  ovaries.  And after 47 years of age, a rectal exam is indicated to check for rectal cancers. We will also provide age appropriate advice regarding health maintenance, like when you should have certain vaccines, screening for sexually transmitted diseases, bone density testing, colonoscopy, mammograms, etc.   MAMMOGRAMS:  All women over 47 years old should have a yearly mammogram. Many facilities now offer a "3D" mammogram, which may cost around $50 extra out of pocket. If possible,  we recommend you accept the option to have the 3D mammogram performed.  It both reduces the number of women who will be called back for extra views which then turn out to be normal, and it is better than the routine mammogram at detecting truly abnormal areas.    COLON CANCER SCREENING: Now recommend starting at age 47. At this time colonoscopy is not covered for routine screening until 47. There are take home tests that can be done between 47-47.   COLONOSCOPY:  Colonoscopy to screen for colon cancer is recommended for all women at age 47.  We know, you hate the idea of the prep.  We agree, BUT, having colon cancer and not knowing it is worse!!  Colon cancer so often starts as a polyp that can be seen and removed at colonscopy, which can quite literally save your life!  And if your first colonoscopy is normal and you have no family history of colon cancer, most women don't have to have it again for  47 years.  Once every ten years, you can do something that may end up saving your life, right?  We will be happy to help you get it scheduled when you are ready.  Be sure to check your insurance coverage so you understand how much it will cost.  It may be covered as a preventative service at no cost, but you should check your particular policy.   ° ° ° °Breast Self-Awareness °Breast self-awareness means being familiar with how your breasts look and feel. It involves checking your breasts regularly and reporting any changes to your  health care provider. °Practicing breast self-awareness is important. A change in your breasts can be a sign of a serious medical problem. Being familiar with how your breasts look and feel allows you to find any problems early, when treatment is more likely to be successful. All women should practice breast self-awareness, including women who have had breast implants. °How to do a breast self-exam °One way to learn what is normal for your breasts and whether your breasts are changing is to do a breast self-exam. To do a breast self-exam: °Look for Changes ° °1. Remove all the clothing above your waist. °2. Stand in front of a mirror in a room with good lighting. °3. Put your hands on your hips. °4. Push your hands firmly downward. °5. Compare your breasts in the mirror. Look for differences between them (asymmetry), such as: °? Differences in shape. °? Differences in size. °? Puckers, dips, and bumps in one breast and not the other. °6. Look at each breast for changes in your skin, such as: °? Redness. °? Scaly areas. °7. Look for changes in your nipples, such as: °? Discharge. °? Bleeding. °? Dimpling. °? Redness. °? A change in position. °Feel for Changes °Carefully feel your breasts for lumps and changes. It is best to do this while lying on your back on the floor and again while sitting or standing in the shower or tub with soapy water on your skin. Feel each breast in the following way: °· Place the arm on the side of the breast you are examining above your head. °· Feel your breast with the other hand. °· Start in the nipple area and make ¾ inch (2 cm) overlapping circles to feel your breast. Use the pads of your three middle fingers to do this. Apply light pressure, then medium pressure, then firm pressure. The light pressure will allow you to feel the tissue closest to the skin. The medium pressure will allow you to feel the tissue that is a little deeper. The firm pressure will allow you to feel the tissue  close to the ribs. °· Continue the overlapping circles, moving downward over the breast until you feel your ribs below your breast. °· Move one finger-width toward the center of the body. Continue to use the ¾ inch (2 cm) overlapping circles to feel your breast as you move slowly up toward your collarbone. °· Continue the up and down exam using all three pressures until you reach your armpit. ° °Write Down What You Find ° °Write down what is normal for each breast and any changes that you find. Keep a written record with breast changes or normal findings for each breast. By writing this information down, you do not need to depend only on memory for size, tenderness, or location. Write down where you are in your menstrual cycle, if you are still menstruating. °If you are having trouble noticing differences   in your breasts, do not get discouraged. With time you will become more familiar with the variations in your breasts and more comfortable with the exam. How often should I examine my breasts? Examine your breasts every month. If you are breastfeeding, the best time to examine your breasts is after a feeding or after using a breast pump. If you menstruate, the best time to examine your breasts is 5-7 days after your period is over. During your period, your breasts are lumpier, and it may be more difficult to notice changes. When should I see my health care provider? See your health care provider if you notice:  A change in shape or size of your breasts or nipples.  A change in the skin of your breast or nipples, such as a reddened or scaly area.  Unusual discharge from your nipples.  A lump or thick area that was not there before.  Pain in your breasts.  Anything that concerns you.  Oral Contraception Information Oral contraceptive pills (OCPs) are medicines taken to prevent pregnancy. OCPs are taken by mouth, and they work by:  Preventing the ovaries from releasing eggs.  Thickening mucus in  the lower part of the uterus (cervix), which prevents sperm from entering the uterus.  Thinning the lining of the uterus (endometrium), which prevents a fertilized egg from attaching to the endometrium. OCPs are highly effective when taken exactly as prescribed. However, OCPs do not prevent STIs (sexually transmitted infections). Safe sex practices, such as using condoms while on an OCP, can help prevent STIs. Before starting OCPs Before you start taking OCPs, you may have a physical exam, blood test, and Pap test. However, you are not required to have a pelvic exam in order to be prescribed OCPs. Your health care provider will make sure you are a good candidate for oral contraception. OCPs are not a good option for certain women, including women who smoke and are older than 35 years, and women with a medical history of high blood pressure, deep vein thrombosis, pulmonary embolism, stroke, cardiovascular disease, or peripheral vascular disease. Discuss with your health care provider the possible side effects of the OCP you may be prescribed. When you start an OCP, be aware that it can take 2-3 months for your body to adjust to changes in hormone levels. Follow instructions from your health care provider about how to start taking your first cycle of OCPs. Depending on when you start the pill, you may need to use a backup form of birth control, such as condoms, during the first week. Make sure you know what steps to take if you ever forget to take the pill. Types of oral contraception  The most common types of birth control pills contain the hormones estrogen and progestin (synthetic progesterone) or progestin only. The combination pill This type of pill contains estrogen and progestin hormones. Combination pills often come in packs of 21, 28, or 91 pills. For each pack, the last 7 pills may not contain hormones, which means you may stop taking the pills for 7 days. Menstrual bleeding occurs during the  week that you do not take the pills or that you take the pills with no hormones in them. The minipill This type of pill contains the progestin hormone only. It comes in packs of 28 pills. All 28 pills contain the hormone. You take the pill every day. It is very important to take the pill at the same time each day. Advantages of oral contraceptive pills  Provides reliable and continuous contraception if taken as instructed.  May treat or decrease symptoms of: ? Menstrual period cramps. ? Irregular menstrual cycle or bleeding. ? Heavy menstrual flow. ? Abnormal uterine bleeding. ? Acne, depending on the type of pill. ? Polycystic ovarian syndrome. ? Endometriosis. ? Iron deficiency anemia. ? Premenstrual symptoms, including premenstrual dysphoric disorder.  May reduce the risk of endometrial and ovarian cancer.  Can be used as emergency contraception.  Prevents mislocated (ectopic) pregnancies and infections of the fallopian tubes. Things that can make oral contraceptive pills less effective OCPs can be less effective if:  You forget to take the pill at the same time every day. This is especially important when taking the minipill.  You have a stomach or intestinal disease that reduces your body's ability to absorb the pill.  You take OCPs with other medicines that make OCPs less effective, such as antibiotics, certain HIV medicines, and some seizure medicines.  You take expired OCPs.  You forget to restart the pill on day 7, if using the packs of 21 pills. Risks associated with oral contraceptive pills Oral contraceptive pills can sometimes cause side effects, such as:  Headache.  Depression.  Trouble sleeping.  Nausea and vomiting.  Breast tenderness.  Irregular bleeding or spotting during the first several months.  Bloating or fluid retention.  Increase in blood pressure. Combination pills are also associated with a small increase in the risk of:  Blood  clots.  Heart attack.  Stroke. Summary  Oral contraceptive pills are medicines taken by mouth to prevent pregnancy. They are highly effective when taken exactly as prescribed.  The most common types of birth control pills contain the hormones estrogen and progestin (synthetic progesterone) or progestin only.  Before you start taking the pill, you may have a physical exam, blood test, and Pap test. Your health care provider will make sure you are a good candidate for oral contraception.  The combination pill may come in a 21-day pack, a 28-day pack, or a 91-day pack. The minipill contains the progesterone hormone only and comes in packs of 28 pills.  Oral contraceptive pills can sometimes cause side effects, such as headache, nausea, breast tenderness, or irregular bleeding. This information is not intended to replace advice given to you by your health care provider. Make sure you discuss any questions you have with your health care provider. Document Released: 11/17/2002 Document Revised: 08/09/2017 Document Reviewed: 11/20/2016 Elsevier Patient Education  2020 [employer].

## 2019-04-23 ENCOUNTER — Telehealth: Payer: Self-pay | Admitting: *Deleted

## 2019-04-23 LAB — LIPID PANEL
Chol/HDL Ratio: 3.5 ratio (ref 0.0–4.4)
Cholesterol, Total: 233 mg/dL — ABNORMAL HIGH (ref 100–199)
HDL: 67 mg/dL (ref >39)
LDL Calculated: 148 mg/dL — ABNORMAL HIGH (ref 0–99)
Triglycerides: 92 mg/dL (ref 0–149)
VLDL Cholesterol Cal: 18 mg/dL (ref 5–40)

## 2019-04-23 LAB — COMPREHENSIVE METABOLIC PANEL
ALT: 11 IU/L (ref 0–32)
AST: 13 IU/L (ref 0–40)
Albumin/Globulin Ratio: 1.8 (ref 1.2–2.2)
Albumin: 4.8 g/dL (ref 3.8–4.8)
Alkaline Phosphatase: 61 IU/L (ref 39–117)
BUN/Creatinine Ratio: 15 (ref 9–23)
BUN: 12 mg/dL (ref 6–24)
Bilirubin Total: 1.1 mg/dL (ref 0.0–1.2)
CO2: 24 mmol/L (ref 20–29)
Calcium: 9.8 mg/dL (ref 8.7–10.2)
Chloride: 98 mmol/L (ref 96–106)
Creatinine, Ser: 0.78 mg/dL (ref 0.57–1.00)
GFR calc Af Amer: 105 mL/min/{1.73_m2} (ref >59)
GFR calc non Af Amer: 91 mL/min/{1.73_m2} (ref >59)
Globulin, Total: 2.6 g/dL (ref 1.5–4.5)
Glucose: 85 mg/dL (ref 65–99)
Potassium: 3.9 mmol/L (ref 3.5–5.2)
Sodium: 138 mmol/L (ref 134–144)
Total Protein: 7.4 g/dL (ref 6.0–8.5)

## 2019-04-23 LAB — CBC
Hematocrit: 40 % (ref 34.0–46.6)
Hemoglobin: 12.9 g/dL (ref 11.1–15.9)
MCH: 30.2 pg (ref 26.6–33.0)
MCHC: 32.3 g/dL (ref 31.5–35.7)
MCV: 94 fL (ref 79–97)
Platelets: 300 10*3/uL (ref 150–450)
RBC: 4.27 x10E6/uL (ref 3.77–5.28)
RDW: 12.5 % (ref 11.7–15.4)
WBC: 4.6 10*3/uL (ref 3.4–10.8)

## 2019-04-23 LAB — TSH: TSH: 1.39 u[IU]/mL (ref 0.450–4.500)

## 2019-04-23 NOTE — Telephone Encounter (Signed)
LM for pt to call back.

## 2019-04-23 NOTE — Telephone Encounter (Signed)
-----   Message from Salvadore Dom, MD sent at 04/23/2019  1:18 PM EDT ----- Please let the patient know that her total cholesterol and LDL are elevated, but the rest of her lipid panel has improved. The rest of her blood work is normal. She should continue to eat a healthy diet, exercise regularly and have repeat blood work next year.

## 2019-04-24 NOTE — Telephone Encounter (Signed)
Patient returning call to Reina. °

## 2019-04-24 NOTE — Telephone Encounter (Signed)
Notes recorded by Polly Cobia, CMA on 04/24/2019 at 8:32 AM EDT  Pt notified. Verbalized understanding.

## 2019-05-08 ENCOUNTER — Other Ambulatory Visit: Payer: Self-pay | Admitting: Obstetrics and Gynecology

## 2019-05-08 NOTE — Telephone Encounter (Signed)
Medication refill request: aldactone  Last AEX:  04/22/19 JJ Next AEX: 05/04/20 Last MMG (if hormonal medication request): 05/05/18 Right breast Bx: fibroadenoma  Refill authorized: 04/15/19 #[id number]. Today please advise

## 2019-05-08 NOTE — Telephone Encounter (Signed)
She typically gets her spironolactone through her dermatologist. 3 months sent, she should f/u there.

## 2019-06-04 ENCOUNTER — Telehealth (INDEPENDENT_AMBULATORY_CARE_PROVIDER_SITE_OTHER): Payer: No Typology Code available for payment source | Admitting: Obstetrics and Gynecology

## 2019-06-04 ENCOUNTER — Encounter: Payer: Self-pay | Admitting: Obstetrics and Gynecology

## 2019-06-04 DIAGNOSIS — Z79890 Hormone replacement therapy (postmenopausal): Secondary | ICD-10-CM | POA: Diagnosis not present

## 2019-06-04 DIAGNOSIS — E7800 Pure hypercholesterolemia, unspecified: Secondary | ICD-10-CM

## 2019-06-04 DIAGNOSIS — N951 Menopausal and female climacteric states: Secondary | ICD-10-CM | POA: Diagnosis not present

## 2019-06-04 MED ORDER — COMBIPATCH 0.05-0.25 MG/DAY TD PTTW: 1.0000 | MEDICATED_PATCH | TRANSDERMAL | 0 refills | Status: DC

## 2019-06-04 NOTE — Progress Notes (Signed)
Virtual Visit via Video Note  I connected with Katrina Morris on 06/04/19 at 11:30 AM EDT by a video enabled telemedicine application and verified that I am speaking with the correct person using two identifiers.  Location: Patient: Home Provider: [facility]   I discussed the limitations of evaluation and management by telemedicine and the availability of in person appointments. The patient expressed understanding and agreed to proceed.  GYNECOLOGY  VISIT   HPI: 48 y.o.   Married White or Caucasian Not Hispanic or Latino  female   [phone] with No LMP recorded.   Virtual visit to f/u on vasomotor symptoms. At her annual exam in mid August she was started on Loseasonique to help with her hot flashes, night sweats, sleep disturbance and cycle changes. She didn't like the birth control, felt more irritable, started gaining weight. Never had bleeding after taking the provera (prior to starting the pill). After a month of taking the pill she stopped it (was forgetting it), she had a cycle about 6 days after stopping the pill. No bleeding since She is having mild hot flashes, not as bad as it was.  She would like to try the the patch.   GYNECOLOGIC HISTORY: No LMP recorded. Contraception:vasectomy Menopausal hormone therapy: none        OB History    Gravida  1   Para  1   Term  1   Preterm      AB      Living  2     SAB      TAB      Ectopic      Multiple  1   Live Births  2              Patient Active Problem List   Diagnosis Date Noted  . Mass of left thigh 01/08/2019  . Viral respiratory illness 12/10/2017  . Allergic cough 12/10/2017    Past Medical History:  Diagnosis Date  . Anxiety   . Dysmenorrhea   . History of pre-eclampsia   . History of pulmonary edema     Past Surgical History:  Procedure Laterality Date  . CESAREAN SECTION    . COLPOSCOPY  1990   NEG   . LIPOMA EXCISION     inner leg     Current Outpatient  Medications  Medication Sig Dispense Refill  . estradiol-norethindrone (COMBIPATCH) 0.05-0.25 MG/DAY Place 1 patch onto the skin 2 (two) times a week. 24 patch 0  . FLUoxetine (PROZAC) 20 MG tablet Take 1 tablet (20 mg total) by mouth daily. 90 tablet 4  . spironolactone (ALDACTONE) 25 MG tablet TAKE 1 TABLET BY MOUTH EVERY DAY 90 tablet 0  . tretinoin (RETIN-A) 0.1 % cream Apply topically at bedtime. 45 g 0   No current facility-administered medications for this visit.      ALLERGIES: Nsaids and Penicillins  Family History  Problem Relation Age of Onset  . Uterine cancer Paternal Grandmother   . Ovarian cysts Mother        Mucinous cysadenoma ovary     Social History   Socioeconomic History  . Marital status: Married    Spouse name: Not on file  . Number of children: Not on file  . Years of education: Not on file  . Highest education level: Not on file  Occupational History  . Not on file  Social Needs  . Financial resource strain: Not on file  . Food insecurity  Worry: Not on file    Inability: Not on file  . Transportation needs    Medical: Not on file    Non-medical: Not on file  Tobacco Use  . Smoking status: Never Smoker  . Smokeless tobacco: Never Used  Substance and Sexual Activity  . Alcohol use: Yes    Alcohol/week: 0.0 - 1.0 standard drinks  . Drug use: Never  . Sexual activity: Yes    Partners: Male    Birth control/protection: Other-see comments    Comment: husband had vasectomy   Lifestyle  . Physical activity    Days per week: Not on file    Minutes per session: Not on file  . Stress: Not on file  Relationships  . Social Herbalist on phone: Not on file    Gets together: Not on file    Attends religious service: Not on file    Active member of club or organization: Not on file    Attends meetings of clubs or organizations: Not on file    Relationship status: Not on file  . Intimate partner violence    Fear of current or ex  partner: Not on file    Emotionally abused: Not on file    Physically abused: Not on file    Forced sexual activity: Not on file  Other Topics Concern  . Not on file  Social History Narrative  . Not on file     PHYSICAL EXAMINATION:    There were no vitals taken for this visit.    General appearance: alert, cooperative and appears stated age  ASSESSMENT Perimenopause Vasomotor symptoms Elevated LDL    PLAN She would like to try low dose HRT patch We discussed the lowest dose patch and cyclic oral progesterone or the mirena IUD, she would prefer to do the combination patch. She is aware that she may have irregular bleeding She is aware of the risks of HRT F/U in 3 months, she will track her bleeding She is cutting back on cheese and eggs and would like to recheck her lipid panel, will discuss again at her next visit.     I provided ~15 minutes of non-face-to-face time during this encounter.   Salvadore Dom, MD

## 2019-07-07 ENCOUNTER — Other Ambulatory Visit (HOSPITAL_COMMUNITY): Payer: Self-pay | Admitting: Obstetrics and Gynecology

## 2019-07-07 DIAGNOSIS — Z1231 Encounter for screening mammogram for malignant neoplasm of breast: Secondary | ICD-10-CM

## 2019-07-15 ENCOUNTER — Encounter (HOSPITAL_COMMUNITY): Payer: Self-pay

## 2019-07-15 ENCOUNTER — Ambulatory Visit (HOSPITAL_COMMUNITY)
Admission: RE | Admit: 2019-07-15 | Discharge: 2019-07-15 | Disposition: A | Discharge status: Home / Self Care | Payer: No Typology Code available for payment source | Source: Ambulatory Visit | Attending: Obstetrics and Gynecology | Admitting: Obstetrics and Gynecology

## 2019-07-15 ENCOUNTER — Other Ambulatory Visit: Payer: Self-pay

## 2019-07-15 DIAGNOSIS — Z1231 Encounter for screening mammogram for malignant neoplasm of breast: Secondary | ICD-10-CM | POA: Insufficient documentation

## 2019-08-26 ENCOUNTER — Other Ambulatory Visit: Payer: Self-pay | Admitting: Obstetrics and Gynecology

## 2019-08-26 NOTE — Telephone Encounter (Signed)
Call to patient. Patient advised to follow up with Dermatology for refill of aldactone. Patient agreeable.

## 2019-08-26 NOTE — Telephone Encounter (Signed)
Message left to return call to [facility] at [phone].   Need patient to advise if her Dermatologist prescribes aldactone for her.

## 2019-08-26 NOTE — Telephone Encounter (Signed)
I would prefer that Dermatology manage that medication for her.

## 2019-08-26 NOTE — Telephone Encounter (Signed)
Patient returned call

## 2019-08-26 NOTE — Telephone Encounter (Signed)
Medication refill request: Aldactone Last AEX:  04-22-2019 JJ  Next AEX: 05-04-20 Last MMG (if hormonal medication request): n/a Refill authorized: Today, please advise.   Spoke with patient. Patient states Dr. Talbert Nan was last to prescribe aldactone for her, so that's why she called our office. Can call Dermatology office if Dr. Talbert Nan does not want to fill for her. RN advised would review with Dr. Talbert Nan and return call. Patient agreeable.

## 2019-10-21 ENCOUNTER — Telehealth: Payer: Self-pay | Admitting: Obstetrics and Gynecology

## 2019-10-21 DIAGNOSIS — E7800 Pure hypercholesterolemia, unspecified: Secondary | ICD-10-CM

## 2019-10-21 NOTE — Telephone Encounter (Signed)
Patient would like referral to Ena Dawley at [facility] for high cholesterol. States that even with diet changes, her cholesterol was elevated and seems to be getting worse.

## 2019-10-22 NOTE — Telephone Encounter (Signed)
Spoke with patient. Patient is requesting a referral to Cardiology for elevated lipid panel. Last 2 lipid panels were abnormal despite diet changes. Patient is concerned due to family Hx, requesting referral. Patient does not have a PCP. Advised patient I will review request with Dr. Talbert Nan and f/u with recommendations. Patient agreeable.   Dr. Talbert Nan -please advise on referral request.

## 2019-10-22 NOTE — Telephone Encounter (Signed)
Spoke with patient, advised per Dr. Talbert Nan. Referral placed to Dr. Ena Dawley at [facility], per patient request. Advised patient our office referral coordinator will f/u with appt details once scheduled. Patient verbalizes understanding and is agreeable.   Routing to [employer] .   Encounter closed.

## 2019-10-22 NOTE — Telephone Encounter (Signed)
Patient returning call to [place]. Patient states OK to leave detailed message at [phone] if you cannot reach her.

## 2019-10-22 NOTE — Telephone Encounter (Signed)
Left message to call Sharee Pimple, RN at [facility].   Last AEX 04/22/19 -elevated LDL MyChart visit on 06/04/19 -discussed diet changes and repeating lipid panel at next visit.

## 2019-10-22 NOTE — Telephone Encounter (Signed)
Please place referral for her. See if there is a particular MD she wants to see.

## 2019-12-09 ENCOUNTER — Ambulatory Visit: Payer: No Typology Code available for payment source | Admitting: Internal Medicine

## 2019-12-25 ENCOUNTER — Encounter: Payer: Self-pay | Admitting: General Practice

## 2019-12-28 ENCOUNTER — Encounter: Payer: Self-pay | Admitting: General Practice

## 2020-04-24 ENCOUNTER — Other Ambulatory Visit: Payer: Self-pay | Admitting: Obstetrics and Gynecology

## 2020-04-25 NOTE — Telephone Encounter (Signed)
Medication refill request: Fluoxetine Last AEX:  04/22/19 JJ Next AEX: 05/04/20 Last MMG (if hormonal medication request): n/a Refill authorized: Today, please advise

## 2020-05-02 NOTE — Progress Notes (Deleted)
49 y.o. G43P1002 Married White or Caucasian Not Hispanic or Latino female here for annual exam.     Patient's last menstrual period was 01/18/2019 (approximate).          Sexually active: {yes no:314532}  The current method of family planning is {contraception:315051}.    Exercising: {yes no:314532}  {types:19826} Smoker:  {YES P5382123  Health Maintenance: Pap:  02/17/18 WNL HPV Neg  History of abnormal Pap:  Yes 1990 Colposcopy Neg  MMG:  07/15/19 Bi rads 1 neg  BMD:   None  Colonoscopy: never  TDaP:  07/29/13 Gardasil: NA   reports that she has never smoked. She has never used smokeless tobacco. She reports current alcohol use. She reports that she does not use drugs.  Past Medical History:  Diagnosis Date  . Allergic cough 12/10/2017  . Anxiety   . Dysmenorrhea   . History of pre-eclampsia   . History of pulmonary edema   . Mass of left thigh 01/08/2019  . Viral respiratory illness 12/10/2017    Past Surgical History:  Procedure Laterality Date  . BREAST BIOPSY Right   . CESAREAN SECTION    . COLPOSCOPY  1990   NEG   . LIPOMA EXCISION     inner leg     Current Outpatient Medications  Medication Sig Dispense Refill  . estradiol-norethindrone (COMBIPATCH) 0.05-0.25 MG/DAY Place 1 patch onto the skin 2 (two) times a week. 24 patch 0  . FLUoxetine (PROZAC) 20 MG tablet TAKE 1 TABLET BY MOUTH EVERY DAY 90 tablet 0  . spironolactone (ALDACTONE) 25 MG tablet TAKE 1 TABLET BY MOUTH EVERY DAY 90 tablet 0  . tretinoin (RETIN-A) 0.1 % cream Apply topically at bedtime. 45 g 0   No current facility-administered medications for this visit.    Family History  Problem Relation Age of Onset  . Uterine cancer Paternal Grandmother   . Ovarian cysts Mother        Mucinous cysadenoma ovary     Review of Systems  Exam:   LMP 01/18/2019 (Approximate)   Weight change: @WEIGHTCHANGE @ Height:      Ht Readings from Last 3 Encounters:  04/22/19 5' 5.5" (1.664 m)  01/27/19 5\' 6"   (1.676 m)  01/08/19 5' 5.5" (1.664 m)    General appearance: alert, cooperative and appears stated age Head: Normocephalic, without obvious abnormality, atraumatic Neck: no adenopathy, supple, symmetrical, trachea midline and thyroid {CHL AMB PHY EX THYROID NORM DEFAULT:[phone]::"normal to inspection and palpation"} Lungs: clear to auscultation bilaterally Cardiovascular: regular rate and rhythm Breasts: {Exam; breast:13139::"normal appearance, no masses or tenderness"} Abdomen: soft, non-tender; non distended,  no masses,  no organomegaly Extremities: extremities normal, atraumatic, no cyanosis or edema Skin: Skin color, texture, turgor normal. No rashes or lesions Lymph nodes: Cervical, supraclavicular, and axillary nodes normal. No abnormal inguinal nodes palpated Neurologic: Grossly normal   Pelvic: External genitalia:  no lesions              Urethra:  normal appearing urethra with no masses, tenderness or lesions              Bartholins and Skenes: normal                 Vagina: normal appearing vagina with normal color and discharge, no lesions              Cervix: {CHL AMB PHY EX CERVIX NORM DEFAULT:[phone]::"no lesions"}  Bimanual Exam:  Uterus:  {CHL AMB PHY EX UTERUS NORM DEFAULT:[phone]::"normal size, contour, position, consistency, mobility, non-tender"}              Adnexa: {CHL AMB PHY EX ADNEXA NO MASS DEFAULT:[phone]::"no mass, fullness, tenderness"}               Rectovaginal: Confirms               Anus:  normal sphincter tone, no lesions  *** chaperoned for the exam.  A:  Well Woman with normal exam  P:

## 2020-05-04 ENCOUNTER — Encounter: Payer: Self-pay | Admitting: Obstetrics and Gynecology

## 2020-05-04 ENCOUNTER — Ambulatory Visit: Payer: No Typology Code available for payment source | Admitting: Obstetrics and Gynecology

## 2020-05-04 ENCOUNTER — Telehealth: Payer: Self-pay

## 2020-05-04 NOTE — Telephone Encounter (Signed)
Patient did not keep appointment for AEX today.

## 2020-10-12 DIAGNOSIS — M25561 Pain in right knee: Secondary | ICD-10-CM | POA: Insufficient documentation

## 2020-12-05 ENCOUNTER — Other Ambulatory Visit (HOSPITAL_COMMUNITY): Payer: Self-pay | Admitting: *Deleted

## 2020-12-12 ENCOUNTER — Ambulatory Visit (HOSPITAL_BASED_OUTPATIENT_CLINIC_OR_DEPARTMENT_OTHER)
Admission: RE | Admit: 2020-12-12 | Discharge: 2020-12-12 | Disposition: A | Discharge status: Home / Self Care | Payer: No Typology Code available for payment source | Source: Ambulatory Visit | Attending: Cardiology | Admitting: Cardiology

## 2020-12-12 ENCOUNTER — Other Ambulatory Visit: Payer: Self-pay

## 2021-01-20 ENCOUNTER — Other Ambulatory Visit: Payer: Self-pay | Admitting: Family Medicine

## 2021-01-20 DIAGNOSIS — Z122 Encounter for screening for malignant neoplasm of respiratory organs: Secondary | ICD-10-CM

## 2021-04-06 ENCOUNTER — Other Ambulatory Visit (HOSPITAL_COMMUNITY): Payer: Self-pay | Admitting: Obstetrics and Gynecology

## 2021-04-06 DIAGNOSIS — Z1231 Encounter for screening mammogram for malignant neoplasm of breast: Secondary | ICD-10-CM

## 2021-04-17 ENCOUNTER — Encounter (HOSPITAL_COMMUNITY): Payer: Self-pay

## 2021-04-17 ENCOUNTER — Inpatient Hospital Stay (HOSPITAL_COMMUNITY): Admission: RE | Admit: 2021-04-17 | Payer: No Typology Code available for payment source | Source: Ambulatory Visit

## 2021-06-22 ENCOUNTER — Other Ambulatory Visit: Payer: Self-pay

## 2021-06-22 MED ORDER — FLUOXETINE HCL 20 MG PO TABS: 20.0000 mg | ORAL_TABLET | Freq: Every day | ORAL | 0 refills | Status: DC

## 2021-06-22 NOTE — Telephone Encounter (Signed)
Patient is requesting refill on Fluoxetine.  Last AEX 04/22/19. AEX is scheduled 08/07/21 (we r/s once).

## 2021-06-26 ENCOUNTER — Ambulatory Visit: Payer: No Typology Code available for payment source | Admitting: Obstetrics and Gynecology

## 2021-07-21 ENCOUNTER — Ambulatory Visit: Payer: No Typology Code available for payment source | Admitting: Obstetrics and Gynecology

## 2021-07-27 NOTE — Progress Notes (Signed)
50 y.o. G79P1002 Married White or Caucasian Not Hispanic or Latino female here for annual exam.  Patient states that she has not been using her combipatch patch. She says that the adhesive on the patch has made her break out.  Last menses was ~8 months ago, prior cycle was months prior. She is having lots of hot flashes, worse at night. She is waking up with sweats ~2 x a night. She is able to go back to sleep. Sleeps well overall. Mood is fine. She has noticed vaginal dryness, no dyspareunia, not using a lubricant. She would like to retry HRT, wants oral estrogen.      Patient's last menstrual period was 01/18/2019 (approximate).          Sexually active: Yes.    The current method of family planning is vasectomy.    Exercising: No.  The patient does not participate in regular exercise at present. Smoker:  no  Health Maintenance: Pap:   02/17/2018 WNL NEG HPV History of abnormal Pap:  Yes 1990s -colposcopy- neg  MMG:  07/15/2019 Density C Bi-rads 1 neg  BMD:   none  Colonoscopy: in 2021, normal. F/U in 10 years.  TDaP:  07/29/13 Gardasil: n/a   reports that she has never smoked. She has never used smokeless tobacco. She reports current alcohol use. She reports that she does not use drugs. Drinks 2-3 glasses of wine a week. 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 Twins girls are 92, one is a Equities trader and one is a Paramedic.   Past Medical History:  Diagnosis Date   Allergic cough 12/10/2017   Anxiety    Dysmenorrhea    History of pre-eclampsia    History of pulmonary edema    Mass of left thigh 01/08/2019   Viral respiratory illness 12/10/2017    Past Surgical History:  Procedure Laterality Date   BREAST BIOPSY Right    CESAREAN SECTION     COLPOSCOPY  1990   NEG    LIPOMA EXCISION     inner leg     Current Outpatient Medications  Medication Sig Dispense Refill   FLUoxetine (PROZAC) 20 MG tablet Take 1 tablet (20 mg total) by mouth daily. 90 tablet 0   spironolactone (ALDACTONE)  25 MG tablet TAKE 1 TABLET BY MOUTH EVERY DAY 90 tablet 0   tretinoin (RETIN-A) 0.1 % cream Apply topically at bedtime. 45 g 0   estradiol-norethindrone (COMBIPATCH) 0.05-0.25 MG/DAY Place 1 patch onto the skin 2 (two) times a week. (Patient not taking: Reported on 08/07/2021) 24 patch 0   No current facility-administered medications for this visit.    Family History  Problem Relation Age of Onset   Uterine cancer Paternal Grandmother    Ovarian cysts Mother        Mucinous cysadenoma ovary     Review of Systems  All other systems reviewed and are negative.  Exam:   BP 110/66   Pulse 76   Ht 5\' 6"  (1.676 m)   Wt 133 lb (60.3 kg)   LMP 01/18/2019 (Approximate)   SpO2 99%   BMI 21.47 kg/m   Weight change: @WEIGHTCHANGE @ Height:   Height: 5\' 6"  (167.6 cm)  Ht Readings from Last 3 Encounters:  08/07/21 5\' 6"  (1.676 m)  04/22/19 5' 5.5" (1.664 m)  01/27/19 5\' 6"  (1.676 m)    General appearance: alert, cooperative and appears stated age Head: Normocephalic, without obvious abnormality, atraumatic Neck: no adenopathy, supple, symmetrical, trachea midline and thyroid  normal to inspection and palpation Lungs: clear to auscultation bilaterally Cardiovascular: regular rate and rhythm Breasts: normal appearance, no masses or tenderness Abdomen: soft, non-tender; non distended,  no masses,  no organomegaly Extremities: extremities normal, atraumatic, no cyanosis or edema Skin: Skin color, texture, turgor normal. No rashes or lesions Lymph nodes: Cervical, supraclavicular, and axillary nodes normal. No abnormal inguinal nodes palpated Neurologic: Grossly normal   Pelvic: External genitalia:  no lesions              Urethra:  normal appearing urethra with no masses, tenderness or lesions              Bartholins and Skenes: normal                 Vagina: normal appearing vagina with normal color and discharge, no lesions              Cervix: no lesions               Bimanual Exam:   Uterus:  normal size, contour, position, consistency, mobility, non-tender and retroverted              Adnexa: no mass, fullness, tenderness               Rectovaginal: Confirms               Anus:  normal sphincter tone, no lesions  Gae Dry, CMA chaperoned for the exam.   1. Well woman exam Mammogram overdue, she will schedule No pap this year Colonoscopy is UTD  2. Perimenopausal vasomotor symptoms Wants to restart HRT, no contraindications, risks reviewed. Didn't tolerate the patch previously - estradiol (ESTRACE) 0.5 MG tablet; Take 1 tablet (0.5 mg total) by mouth daily.  Dispense: 90 tablet; Refill: 3 - progesterone (PROMETRIUM) 100 MG capsule; Take 1 capsule (100 mg total) by mouth daily.  Dispense: 90 capsule; Refill: 3  3. Counseling for hormone replacement therapy See above - estradiol (ESTRACE) 0.5 MG tablet; Take 1 tablet (0.5 mg total) by mouth daily.  Dispense: 90 tablet; Refill: 3 - progesterone (PROMETRIUM) 100 MG capsule; Take 1 capsule (100 mg total) by mouth daily.  Dispense: 90 capsule; Refill: 3

## 2021-08-07 ENCOUNTER — Other Ambulatory Visit: Payer: Self-pay

## 2021-08-07 ENCOUNTER — Ambulatory Visit (INDEPENDENT_AMBULATORY_CARE_PROVIDER_SITE_OTHER): Payer: No Typology Code available for payment source | Admitting: Obstetrics and Gynecology

## 2021-08-07 ENCOUNTER — Encounter: Payer: Self-pay | Admitting: Obstetrics and Gynecology

## 2021-08-07 VITALS — BP 110/66 | HR 76 | Ht 66.0 in | Wt 133.0 lb

## 2021-08-07 DIAGNOSIS — N951 Menopausal and female climacteric states: Secondary | ICD-10-CM

## 2021-08-07 DIAGNOSIS — Z01419 Encounter for gynecological examination (general) (routine) without abnormal findings: Secondary | ICD-10-CM | POA: Diagnosis not present

## 2021-08-07 DIAGNOSIS — Z7189 Other specified counseling: Secondary | ICD-10-CM

## 2021-08-07 MED ORDER — PROGESTERONE MICRONIZED 100 MG PO CAPS: 100.0000 mg | ORAL_CAPSULE | Freq: Every day | ORAL | 3 refills | Status: DC

## 2021-08-07 MED ORDER — ESTRADIOL 0.5 MG PO TABS: 0.5000 mg | ORAL_TABLET | Freq: Every day | ORAL | 3 refills | Status: DC

## 2021-08-07 NOTE — Patient Instructions (Addendum)
Try uberlube for vaginal lubrication.   EXERCISE   We recommended that you start or continue a regular exercise program for good health. Physical activity is anything that gets your body moving, some is better than none. The CDC recommends 150 minutes per week of Moderate-Intensity Aerobic Activity and 2 or more days of Muscle Strengthening Activity.  Benefits of exercise are limitless: helps weight loss/weight maintenance, improves mood and energy, helps with depression and anxiety, improves sleep, tones and strengthens muscles, improves balance, improves bone density, protects from chronic conditions such as heart disease, high blood pressure and diabetes and so much more. To learn more visit: WhyNotPoker.uy  DIET: Good nutrition starts with a healthy diet of fruits, vegetables, whole grains, and lean protein sources. Drink plenty of water for hydration. Minimize empty calories, sodium, sweets. For more information about dietary recommendations visit: GeekRegister.com.ee and [email or web address]-[email or web address]  ALCOHOL:  Women should limit their alcohol intake to no more than 7 drinks/beers/glasses of wine (combined, not each!) per week. Moderation of alcohol intake to this level decreases your risk of breast cancer and liver damage.  If you are concerned that you may have a problem, or your friends have told you they are concerned about your drinking, there are many resources to help. A well-known program that is free, effective, and available to all people all over the nation is Alcoholics Anonymous.  Check out this site to learn more: BlockTaxes.se   CALCIUM AND VITAMIN D:  Adequate intake of calcium and Vitamin D are recommended for bone health.  You should be getting between 1000-1200 mg of calcium and 800 units of Vitamin D daily between diet and supplements  PAP SMEARS:  Pap smears, to check for cervical cancer  or precancers,  have traditionally been done yearly, scientific advances have shown that most women can have pap smears less often.  However, every woman still should have a physical exam from her gynecologist every year. It will include a breast check, inspection of the vulva and vagina to check for abnormal growths or skin changes, a visual exam of the cervix, and then an exam to evaluate the size and shape of the uterus and ovaries. We will also provide age appropriate advice regarding health maintenance, like when you should have certain vaccines, screening for sexually transmitted diseases, bone density testing, colonoscopy, mammograms, etc.   MAMMOGRAMS:  All women over 73 years old should have a routine mammogram.   COLON CANCER SCREENING: Now recommend starting at age 90. At this time colonoscopy is not covered for routine screening until 50. There are take home tests that can be done between 45-49.   COLONOSCOPY:  Colonoscopy to screen for colon cancer is recommended for all women at age 32.  We know, you hate the idea of the prep.  We agree, BUT, having colon cancer and not knowing it is worse!!  Colon cancer so often starts as a polyp that can be seen and removed at colonscopy, which can quite literally save your life!  And if your first colonoscopy is normal and you have no family history of colon cancer, most women don't have to have it again for 10 years.  Once every ten years, you can do something that may end up saving your life, right?  We will be happy to help you get it scheduled when you are ready.  Be sure to check your insurance coverage so you understand how much it will cost.  It may be covered as a preventative service  at no cost, but you should check your particular policy.      Breast Self-Awareness Breast self-awareness means being familiar with how your breasts look and feel. It involves checking your breasts regularly and reporting any changes to your health care  provider. Practicing breast self-awareness is important. A change in your breasts can be a sign of a serious medical problem. Being familiar with how your breasts look and feel allows you to find any problems early, when treatment is more likely to be successful. All women should practice breast self-awareness, including women who have had breast implants. How to do a breast self-exam One way to learn what is normal for your breasts and whether your breasts are changing is to do a breast self-exam. To do a breast self-exam: Look for Changes  Remove all the clothing above your waist. Stand in front of a mirror in a room with good lighting. Put your hands on your hips. Push your hands firmly downward. Compare your breasts in the mirror. Look for differences between them (asymmetry), such as: Differences in shape. Differences in size. Puckers, dips, and bumps in one breast and not the other. Look at each breast for changes in your skin, such as: Redness. Scaly areas. Look for changes in your nipples, such as: Discharge. Bleeding. Dimpling. Redness. A change in position. Feel for Changes Carefully feel your breasts for lumps and changes. It is best to do this while lying on your back on the floor and again while sitting or standing in the shower or tub with soapy water on your skin. Feel each breast in the following way: Place the arm on the side of the breast you are examining above your head. Feel your breast with the other hand. Start in the nipple area and make  inch (2 cm) overlapping circles to feel your breast. Use the pads of your three middle fingers to do this. Apply light pressure, then medium pressure, then firm pressure. The light pressure will allow you to feel the tissue closest to the skin. The medium pressure will allow you to feel the tissue that is a little deeper. The firm pressure will allow you to feel the tissue close to the ribs. Continue the overlapping circles,  moving downward over the breast until you feel your ribs below your breast. Move one finger-width toward the center of the body. Continue to use the  inch (2 cm) overlapping circles to feel your breast as you move slowly up toward your collarbone. Continue the up and down exam using all three pressures until you reach your armpit.  Write Down What You Find  Write down what is normal for each breast and any changes that you find. Keep a written record with breast changes or normal findings for each breast. By writing this information down, you do not need to depend only on memory for size, tenderness, or location. Write down where you are in your menstrual cycle, if you are still menstruating. If you are having trouble noticing differences in your breasts, do not get discouraged. With time you will become more familiar with the variations in your breasts and more comfortable with the exam. How often should I examine my breasts? Examine your breasts every month. If you are breastfeeding, the best time to examine your breasts is after a feeding or after using a breast pump. If you menstruate, the best time to examine your breasts is 5-7 days after your period is over. During your period, your breasts  are lumpier, and it may be more difficult to notice changes. When should I see my health care provider? See your health care provider if you notice: A change in shape or size of your breasts or nipples. A change in the skin of your breast or nipples, such as a reddened or scaly area. Unusual discharge from your nipples. A lump or thick area that was not there before. Pain in your breasts. Anything that concerns you. Menopause Menopause is the normal time of a woman's life when menstrual periods stop completely. It marks the natural end to a woman's ability to become pregnant. It can be defined as the absence of a menstrual period for 12 months without another medical cause. The transition to menopause  (perimenopause) most often happens between the ages of 90 and 54, and can last for many years. During perimenopause, hormone levels change in your body, which can cause symptoms and affect your health. Menopause may increase your risk for: Weakened bones (osteoporosis), which causes fractures. Depression. Hardening and narrowing of the arteries (atherosclerosis), which can cause heart attacks and strokes. What are the causes? This condition is usually caused by a natural change in hormone levels that happens as you get older. The condition may also be caused by changes that are not natural, including: Surgery to remove both ovaries (surgical menopause). Side effects from some medicines, such as chemotherapy used to treat cancer (chemical menopause). What increases the risk? This condition is more likely to start at an earlier age if you have certain medical conditions or have undergone treatments, including: A tumor of the pituitary gland in the brain. A disease that affects the ovaries and hormones. Certain cancer treatments, such as chemotherapy or hormone therapy, or radiation therapy on the pelvis. Heavy smoking and excessive alcohol use. Family history of early menopause. This condition is also more likely to develop earlier in women who are very thin. What are the signs or symptoms? Symptoms of this condition include: Hot flashes. Irregular menstrual periods. Night sweats. Changes in feelings about sex. This could be a decrease in sex drive or an increased discomfort around your sexuality. Vaginal dryness and thinning of the vaginal walls. This may cause painful sex. Dryness of the skin and development of wrinkles. Headaches. Problems sleeping (insomnia). Mood swings or irritability. Memory problems. Weight gain. Hair growth on the face and chest. Bladder infections or problems with urinating. How is this diagnosed? This condition is diagnosed based on your medical history, a  physical exam, your age, your menstrual history, and your symptoms. Hormone tests may also be done. How is this treated? In some cases, no treatment is needed. You and your health care provider should make a decision together about whether treatment is necessary. Treatment will be based on your individual condition and preferences. Treatment for this condition focuses on managing symptoms. Treatment may include: Menopausal hormone therapy (MHT). Medicines to treat specific symptoms or complications. Acupuncture. Vitamin or herbal supplements. Before starting treatment, make sure to let your health care provider know if you have a personal or family history of these conditions: Heart disease. Breast cancer. Blood clots. Diabetes. Osteoporosis. Follow these instructions at home: Lifestyle Do not use any products that contain nicotine or tobacco, such as cigarettes, e-cigarettes, and chewing tobacco. If you need help quitting, ask your health care provider. Get at least 30 minutes of physical activity on 5 or more days each week. Avoid alcoholic and caffeinated beverages, as well as spicy foods. This may help prevent  hot flashes. Get 7-8 hours of sleep each night. If you have hot flashes, try: Dressing in layers. Avoiding things that may trigger hot flashes, such as spicy food, warm places, or stress. Taking slow, deep breaths when a hot flash starts. Keeping a fan in your home and office. Find ways to manage stress, such as deep breathing, meditation, or journaling. Consider going to group therapy with other women who are having menopause symptoms. Ask your health care provider about recommended group therapy meetings. Eating and drinking  Eat a healthy, balanced diet that contains whole grains, lean protein, low-fat dairy, and plenty of fruits and vegetables. Your health care provider may recommend adding more soy to your diet. Foods that contain soy include tofu, tempeh, and soy  milk. Eat plenty of foods that contain calcium and vitamin D for bone health. Items that are rich in calcium include low-fat milk, yogurt, beans, almonds, sardines, broccoli, and kale. Medicines Take over-the-counter and prescription medicines only as told by your health care provider. Talk with your health care provider before starting any herbal supplements. If prescribed, take vitamins and supplements as told by your health care provider. General instructions  Keep track of your menstrual periods, including: When they occur. How heavy they are and how long they last. How much time passes between periods. Keep track of your symptoms, noting when they start, how often you have them, and how long they last. Use vaginal lubricants or moisturizers to help with vaginal dryness and improve comfort during sex. Keep all follow-up visits. This is important. This includes any group therapy or counseling. Contact a health care provider if: You are still having menstrual periods after age 2. You have pain during sex. You have not had a period for 12 months and you develop vaginal bleeding. Get help right away if you have: Severe depression. Excessive vaginal bleeding. Pain when you urinate. A fast or irregular heartbeat (palpitations). Severe headaches. Abdominal pain or severe indigestion. Summary Menopause is a normal time of life when menstrual periods stop completely. It is usually defined as the absence of a menstrual period for 12 months without another medical cause. The transition to menopause (perimenopause) most often happens between the ages of 75 and 10 and can last for several years. Symptoms can be managed through medicines, lifestyle changes, and complementary therapies such as acupuncture. Eat a balanced diet that is rich in nutrients to promote bone health and heart health and to manage symptoms during menopause. This information is not intended to replace advice given to you by  your health care provider. Make sure you discuss any questions you have with your health care provider. Document Revised: 05/27/2020 Document Reviewed: 02/11/2020 Elsevier Patient Education  [place]. Menopause and Hormone Replacement Therapy Menopause is a normal time of life when menstrual periods stop completely and the ovaries stop producing the female hormones estrogen and progesterone. Low levels of these hormones can affect your health and cause symptoms. Hormone replacement therapy (HRT) can relieve some of those symptoms. HRT is the use of artificial (synthetic) hormones to replace hormones that your body has stopped producing because you have reached menopause. Types of HRT HRT may consist of the synthetic hormones estrogen and progestin, or it may consist of estrogen-only therapy. You and your health care provider will decide which form of HRT is best for you. If you choose to be on HRT and you have a uterus, estrogen and progestin are usually prescribed. Estrogen-only therapy is used for women  who do not have a uterus. Possible options for taking HRT include: Pills. Patches. Gels. Sprays. Vaginal cream. Vaginal rings. Vaginal inserts. The amount of hormones that you take and how long you take them varies according to your health. It is important to: Begin HRT with the lowest possible dosage. Stop HRT as soon as your health care provider tells you to stop. Work with your health care provider so that you feel informed and comfortable with your decisions. Tell a health care provider about: Any allergies you have. Whether you have had blood clots or know of any risk factors you may have for blood clots. Whether you or family members have had cancer, especially cancer of the breasts, ovaries, or uterus. Any surgeries you have had. All medicines you are taking, including vitamins, herbs, eye drops, creams, and over-the-counter medicines. Whether you are pregnant or may be  pregnant. Any medical conditions you have. What are the benefits? HRT can reduce the frequency and severity of menopausal symptoms. Benefits of HRT vary according to the kind of symptoms that you have, how severe they are, and your overall health. HRT may help to improve the following symptoms of menopause: Hot flashes and night sweats. These are sudden feelings of heat that spread over the face and body. The skin may turn red, like a blush. Night sweats are hot flashes that happen while you are sleeping or trying to sleep. Bone loss (osteoporosis). The body loses calcium more quickly after menopause, causing the bones to become weaker. This can increase the risk for bone breaks (fractures). Vaginal dryness. The lining of the vagina can become thin and dry, which can cause pain during sex or cause infection, burning, or itching. Urinary tract infections. Urinary incontinence. This is the inability to control when you urinate. Irritability. Short-term memory problems. What are the risks? Risks of HRT vary depending on your individual health and medical history. Risks of HRT also depend on whether you receive both estrogen and progestin or you receive estrogen only. HRT may increase the risk of: Spotting. This is when a small amount of blood leaks from the vagina unexpectedly. Endometrial cancer. This cancer is in the lining of the uterus (endometrium). Breast cancer. Increased density of breast tissue. This can make it harder to find breast cancer on a breast X-ray (mammogram). Stroke. Heart disease. Blood clots. Gallbladder disease or liver disease. Risks of HRT can increase if you have any of the following conditions: Endometrial cancer. Liver disease. Heart disease. Breast cancer. History of blood clots. History of stroke. Follow these instructions at home: Pap tests Have Pap tests done as often as told by your health care provider. A Pap test is sometimes called a Pap smear. It is a  screening test that is used to check for signs of cancer of the cervix and vagina. A Pap test can also identify the presence of infection or precancerous changes. Pap tests may be done: Every 3 years, starting at age 64. Every 5 years, starting after age 87, in combination with testing for human papillomavirus (HPV). More often or less often depending on other medical conditions you have, your age, and other risk factors. It is up to you to get the results of your Pap test. Ask your health care provider, or the department that is doing the test, when your results will be ready. General instructions Take over-the-counter and prescription medicines only as told by your health care provider. Do not use any products that contain nicotine or tobacco.  These products include cigarettes, chewing tobacco, and vaping devices, such as e-cigarettes. If you need help quitting, ask your health care provider. Get mammograms, pelvic exams, and medical checkups as often as told by your health care provider. Keep all follow-up visits. This is important. Contact a health care provider if you have: Pain or swelling in your legs. Lumps or changes in your breasts or armpits. Pain, burning, or bleeding when you urinate. Unusual vaginal bleeding. Dizziness or headaches. Pain in your abdomen. Get help right away if you have: Shortness of breath. Chest pain. Slurred speech. Weakness or numbness in any part of your arms or legs. These symptoms may represent a serious problem that is an emergency. Do not wait to see if the symptoms will go away. Get medical help right away. Call your local emergency services (911 in the [place] Do not drive yourself to the hospital. Summary Menopause is a normal time of life when menstrual periods stop completely and the ovaries stop producing the female hormones estrogen and progesterone. HRT can reduce the frequency and severity of menopausal symptoms. Risks of HRT vary depending on  your individual health and medical history. This information is not intended to replace advice given to you by your health care provider. Make sure you discuss any questions you have with your health care provider. Document Revised: 02/29/2020 Document Reviewed: 02/29/2020 Elsevier Patient Education  [place].

## 2021-09-18 ENCOUNTER — Other Ambulatory Visit: Payer: Self-pay | Admitting: Obstetrics and Gynecology

## 2021-09-18 NOTE — Telephone Encounter (Signed)
Annual exam was on 08/07/21

## 2021-11-23 ENCOUNTER — Other Ambulatory Visit: Payer: Self-pay

## 2021-11-23 ENCOUNTER — Ambulatory Visit (INDEPENDENT_AMBULATORY_CARE_PROVIDER_SITE_OTHER): Payer: No Typology Code available for payment source | Admitting: Allergy & Immunology

## 2021-11-23 ENCOUNTER — Encounter: Payer: Self-pay | Admitting: Allergy & Immunology

## 2021-11-23 VITALS — BP 110/80 | HR 92 | Temp 97.0°F | Resp 20 | Ht 65.5 in | Wt 130.0 lb

## 2021-11-23 DIAGNOSIS — R220 Localized swelling, mass and lump, head: Secondary | ICD-10-CM | POA: Diagnosis not present

## 2021-11-23 DIAGNOSIS — R600 Localized edema: Secondary | ICD-10-CM

## 2021-11-23 NOTE — Progress Notes (Signed)
? ?NEW PATIENT ? ?Date of Service/Encounter:  11/23/21 ? ?Consult requested by: Antony Contras, MD ? ? ?Assessment:  ? ?Lip swelling ? ?Periorbital edema ? ?History of multiple nevi - sees Dr. Renda Rolls ? ? ?Katrina Morris is a 51 y.o. female presenting for an evaluation of edema of the face, most prominently her lips and her eyes. This does seem to be responsive to antihistamines, pointing towards a histaminergic process. This is also responsive to prednisone. Fortunately (or unfortunately depending on your point of view), testing to the environmental panel was negative today. I was presuming that she would be positive to dust mites which would explain to occurrence only in the morning. I think the next step would be to do patch testing to rule out triggers of contract dermatitis. Of note, she does wear a mouthguard, although she is unsure whether the start of the mouthguard wearing corresponds with the onset of her swelling episodes. But certainly testing for sensitization to this and other common contact dermatitis triggers is important in her diagnostic journey. We could do blood work to look for odd causes of angioedema such as alpha gal syndrome and autoimmune etiologies, however I think it is more low yield in her particular case given the isolated nature of the edema.  ? ?Plan/Recommendations:  ? ?1. Lip swelling ?- We did testing to the entire environmental panel and you were completely negative. ?- Copy of testing results provided. ?- There goes my idea of dust mites or feather sensitization!  ?- Consider starting Allegra instead of Claritin (it is stronger AND passes through the blood-brain barrier the least of the antihistamines). ?- We will schedule you for patch testing, which looks for chemical sensitivities. ?- Also bring in a sample of your mouth guard and your cosmetics.  ? ?2. Return in about 2 weeks (around 12/07/2021) for [facility] TESTING.  ? ? ? ? ?This note in its entirety was forwarded to the Provider  who requested this consultation. ? ?Subjective:  ? ?Katrina Morris is a 51 y.o. female presenting today for evaluation of  ?Chief Complaint  ?Patient presents with  ? Angioedema  ?  On/off for the 6 mths  ? ? ?Katrina Morris has a history of the following: ?Patient Active Problem List  ? Diagnosis Date Noted  ? Mass of left thigh 01/08/2019  ? Viral respiratory illness 12/10/2017  ? Allergic cough 12/10/2017  ? ? ?History obtained from: chart review and patient. ? ?Katrina Morris was referred by Antony Contras, MD.    ? ?Katrina Morris is a 51 y.o. female presenting for an evaluation of lip swelling and periorbital edema . Today her lips and eyes look normal. ? ?She reports that she has had swelling of her lips and puffiness of her eyes. She did go to see dermatology a couple of months ago. It was felt that this was rosacea. She has a known history of acne and this has transitioned into more of the roasecea. She has been in retinoids and spironolactone long term. Matronidazole was added at this time.  ? ?She will wake up with puffy lips as well as periorbital edema. This is accompanied by pruritis. She has some red rash around her lips. She was told to use Cerve and this might have made it even worse.  ? ?She did take a couple of courses of prednisone and it did seem to help. But obliviously she knows that this is no longer term solution to her symptoms. She started taking Claritin  ever morning. She had to stop the Claritin for the visit today and it did worsen without the antihistamine on board.  ? ?There is one dog who stays outside of the home at all times. Otherwise no pets. She was using Mindi Curling which she has been using "forever". Of note, she does have mouth guard. She is nuusre whether the start of using this corresponds with the start of her symptoms.  ? ?One daughter has dust mite allergy. Another has oral allergy syndrome. Otherwise atopic history in the family is largely negative.  ? ?She does see  Dermatology for other skin issues, including nevi and regular mole checks. She was seeing Dr. Wray Kearns and then she switched to Dr. Renda Rolls. ? ?Otherwise, there is no history of other atopic diseases, including asthma, food allergies, drug allergies, environmental allergies, stinging insect allergies, or contact dermatitis. There is no significant infectious history. Vaccinations are up to date.  ? ? ?Past Medical History: ?Patient Active Problem List  ? Diagnosis Date Noted  ? Mass of left thigh 01/08/2019  ? Viral respiratory illness 12/10/2017  ? Allergic cough 12/10/2017  ? ? ?Medication List:  ?Allergies as of 11/23/2021   ? ?   Reactions  ? Bee Pollen   ? Nsaids Other (See Comments)  ? GI upset  ? Penicillins Hives  ? ?  ? ?  ?Medication List  ?  ? ?  ? Accurate as of November 23, 2021 11:59 PM. If you have any questions, ask your nurse or doctor.  ?  ?  ? ?  ? ?estradiol 0.5 MG tablet ?Commonly known as: ESTRACE ?Take 1 tablet (0.5 mg total) by mouth daily. ?  ?FLUoxetine 20 MG tablet ?Commonly known as: PROZAC ?TAKE 1 TABLET BY MOUTH EVERY DAY ?  ?metroNIDAZOLE 0.75 % cream ?Commonly known as: METROCREAM ?Apply 1 application. topically daily at 12 noon. ?  ?progesterone 100 MG capsule ?Commonly known as: PROMETRIUM ?Take 1 capsule (100 mg total) by mouth daily. ?  ?spironolactone 25 MG tablet ?Commonly known as: ALDACTONE ?TAKE 1 TABLET BY MOUTH EVERY DAY ?  ?tretinoin 0.1 % cream ?Commonly known as: RETIN-A ?Apply topically at bedtime. ?  ? ?  ? ? ?Birth History: non-contributory ? ?Developmental History: non-contributory ? ?Past Surgical History: ?Past Surgical History:  ?Procedure Laterality Date  ? BREAST BIOPSY Right   ? CESAREAN SECTION    ? COLPOSCOPY  1990  ? NEG   ? LIPOMA EXCISION    ? inner leg   ? ? ? ?Family History: ?Family History  ?Problem Relation Age of Onset  ? Uterine cancer Paternal Grandmother   ? Ovarian cysts Mother   ?     Mucinous cysadenoma ovary   ? ? ? ?Social History: Katrina Morris  lives at home with her family including her husband and two daughters. They live in a house that was built in 2004. There is hardwood throughout the home. They have gas heating and central cooling. There is a heat pump as well. There is one dog inside of the home, who is not new to the home. There are no dust mite coverings on the bedding. There is no tobacco exposure in the home. 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 She is not exposed to fumes, chemicals, or dust. She does use a HEPA filter in the home. She does not live near an interstate or industrial area.   ? ? ?  Review of Systems  ?Constitutional: Negative.  Negative for chills, fever, malaise/fatigue and weight loss.  ?HENT: Negative.  Negative for congestion, ear discharge, ear pain and sinus pain.   ?Eyes:  Negative for pain, discharge and redness.  ?Respiratory:  Negative for cough, sputum production, shortness of breath and wheezing.   ?Cardiovascular: Negative.  Negative for chest pain and palpitations.  ?Gastrointestinal:  Negative for abdominal pain, constipation, diarrhea, heartburn, nausea and vomiting.  ?Skin:  Positive for itching and rash.  ?Neurological:  Negative for dizziness and headaches.  ?Endo/Heme/Allergies:  Negative for environmental allergies. Does not bruise/bleed easily.   ? ? ? ?Objective:  ? ?Blood pressure 110/80, pulse 92, temperature (!) 97 ?F (36.1 ?C), resp. rate 20, height 5' 5.5" (1.664 m), weight 130 lb (59 kg), last menstrual period 01/18/2019, SpO2 100 %. ?Body mass index is 21.3 kg/m?. ? ? ? ? ?Physical Exam ?Vitals reviewed.  ?Constitutional:   ?   Appearance: She is well-developed.  ?   Comments: Very pleasant female. Cooperative with the exam. Friendly.   ?HENT:  ?   Head: Normocephalic and atraumatic.  ?   Right Ear: Tympanic membrane, ear canal and external ear normal. No drainage, swelling or tenderness. Tympanic membrane is not injected, scarred,  erythematous, retracted or bulging.  ?   Left Ear: Tympanic membrane, ear canal and external ear normal. No drainage, swelling or tenderness. Tympanic membrane is not injected, scarred, erythematous, retracted

## 2021-11-23 NOTE — Patient Instructions (Addendum)
1. Lip swelling ?- We did testing to the entire environmental panel and you were completely negative. ?- Copy of testing results provided. ?- There goes my idea of dust mites or feather sensitization!  ?- Consider starting Allegra instead of Claritin (it is stronger AND passes through the blood-brain barrier the least of the antihistamines). ?- We will schedule you for patch testing, which looks for chemical sensitivities. ?- Also bring in a sample of your mouth guard and your cosmetics.  ? ?2. Return in about 2 weeks (around 12/07/2021) for [facility] TESTING.  ? ? ?Please inform us of any Emergency Department visits, hospitalizations, or changes in symptoms. Call us before going to the ED for breathing or allergy symptoms since we might be able to fit you in for a sick visit. Feel free to contact us anytime with any questions, problems, or concerns. ? ?It was a pleasure to meet you in person today! ? ?Websites that have reliable patient information: ?1. American Academy of Asthma, Allergy, and Immunology: www.aaaai.org ?2. Food Allergy Research and Education (FARE): foodallergy.org ?3. Mothers of Asthmatics: http://www.asthmacommunitynetwork.org ?4. [employer] of Allergy, Asthma, and Immunology: MonthlyElectricBill.co.uk ? ? ?COVID-19 Vaccine Information can be found at: ShippingScam.co.uk For questions related to vaccine distribution or appointments, please email [email or web address] or call [phone].  ? ?We realize that you might be concerned about having an allergic reaction to the COVID19 vaccines. To help with that concern, WE ARE OFFERING THE COVID19 VACCINES IN OUR OFFICE! Ask the front desk for dates!  ? ? ? ??Like? [place] on Facebook and Instagram for our latest updates!  ?  ? ? ?A healthy democracy works best when [employer] participate! Make sure you are registered to vote! If you have moved or changed any of your contact information, you will need to get  this updated before voting! ? ?In some cases, you MAY be able to register to vote online: [email or web address] ? ? ? ? ?True Test looks for the following sensitivities:  ? ? ? ? ? ? Airborne Adult Perc - 11/23/21 1500   ? ? Time Antigen Placed 4580   ? Allergen Manufacturer Lavella Hammock   ? Location Back   ? Number of Test 59   ? 1. Control-Buffer 50% Glycerol Negative   ? 2. Control-Histamine 1 mg/ml 2+   ? 3. Albumin saline Negative   ? 4. [place] Negative   ? 5. [place] Negative   ? 6. [place] Negative   ? 7. [place] Blue Negative   ? 8. Meadow Fescue Negative   ? 9. Perennial Rye Negative   ? 10. Sweet Vernal Negative   ? 11. Timothy Negative   ? 12. Cocklebur Negative   ? 13. Burweed Marshelder Negative   ? 14. Ragweed, short Negative   ? 15. Ragweed, Giant Negative   ? 16. Plantain,  English Negative   ? 17. Lamb's Quarters Negative   ? 18. Sheep Sorrell Negative   ? 19. Rough Pigweed Negative   ? 20. Marsh Elder, Rough Negative   ? 21. Mugwort, Common Negative   ? 22. Ash mix Negative   ? 23. Wendee Copp mix Negative   ? 24. Beech American Negative   ? 25. Box, Elder Negative   ? 26. Cedar, red Negative   ? 27. Cottonwood, [place] Negative   ? 28. Elm mix Negative   ? 29. Hickory Negative   ? 30. Maple mix Negative   ? 31. Oak, [place] mix Negative   ? 32. Pecan  Pollen Negative   ? 33. Pine mix Negative   ? 34. Sycamore Eastern Negative   ? 35. Walnut, Black Pollen Negative   ? 36. Alternaria alternata Negative   ? 44. Cladosporium Herbarum Negative   ? 38. Aspergillus mix Negative   ? 39. Penicillium mix Negative   ? 40. Bipolaris sorokiniana (Helminthosporium) Negative   ? 41. Drechslera spicifera (Curvularia) Negative   ? 42. Mucor plumbeus Negative   ? 43. Fusarium moniliforme Negative   ? 44. Aureobasidium pullulans (pullulara) Negative   ? 45. Rhizopus oryzae Negative   ? 46. Botrytis cinera Negative   ? 47. Epicoccum nigrum Negative   ? 48. Phoma betae Negative   ? 49. Candida Albicans  Negative   ? 50. Trichophyton mentagrophytes Negative   ? 51. Mite, D Farinae  5,000 AU/ml Negative   ? 52. Mite, D Pteronyssinus  5,000 AU/ml Negative   ? 53. Cat Hair 10,000 BAU/ml Negative   ? 54.  Dog Epithelia Negative   ? 55. Mixed Feathers Negative   ? 56. Horse Epithelia Negative   ? 57. Cockroach, [place] Negative   ? 58. Mouse Negative   ? 59. Tobacco Leaf Negative   ? Comments \   ? ?  ?  ? ?  ? ? ? ? ? ? ? ? ?

## 2021-12-01 ENCOUNTER — Encounter: Payer: Self-pay | Admitting: Allergy & Immunology

## 2021-12-12 NOTE — Addendum Note (Signed)
Addended by: Valentina Shaggy on: 12/12/2021 01:45 PM ? ? Modules accepted: Orders ? ?

## 2021-12-12 NOTE — Progress Notes (Signed)
I talked to the patient and she is interested in pursuing autoimmune screening. Ordered labs.  ? ?Salvatore Marvel, MD ?[facility] [place] [facility] ? ?

## 2022-01-17 ENCOUNTER — Encounter: Payer: Self-pay | Admitting: Obstetrics and Gynecology

## 2022-01-17 DIAGNOSIS — N950 Postmenopausal bleeding: Secondary | ICD-10-CM

## 2022-01-18 ENCOUNTER — Other Ambulatory Visit (HOSPITAL_COMMUNITY): Payer: Self-pay | Admitting: Obstetrics and Gynecology

## 2022-01-18 DIAGNOSIS — Z1231 Encounter for screening mammogram for malignant neoplasm of breast: Secondary | ICD-10-CM

## 2022-01-18 NOTE — Telephone Encounter (Signed)
Please schedule her for an ultrasound in the next few weeks with a f/u visit with me.  ?

## 2022-01-18 NOTE — Telephone Encounter (Signed)
Order placed, forward to appointments to schedule.  ?

## 2022-01-22 ENCOUNTER — Ambulatory Visit (HOSPITAL_COMMUNITY)
Admission: RE | Admit: 2022-01-22 | Discharge: 2022-01-22 | Disposition: A | Discharge status: Home / Self Care | Payer: No Typology Code available for payment source | Source: Ambulatory Visit | Attending: Diagnostic Radiology | Admitting: Diagnostic Radiology

## 2022-01-22 DIAGNOSIS — Z1231 Encounter for screening mammogram for malignant neoplasm of breast: Secondary | ICD-10-CM | POA: Diagnosis present

## 2022-01-24 LAB — RHEUMATOID FACTOR: Rhuematoid fact SerPl-aCnc: <10 IU/mL (ref <14.0)

## 2022-01-24 LAB — ANTINUCLEAR ANTIBODIES, IFA: ANA Titer 1: NEGATIVE

## 2022-01-24 LAB — C-REACTIVE PROTEIN: CRP: <1 mg/L (ref 0–10)

## 2022-01-24 LAB — SEDIMENTATION RATE: Sed Rate: 2 mm/hr (ref 0–40)

## 2022-02-27 ENCOUNTER — Other Ambulatory Visit: Payer: No Typology Code available for payment source

## 2022-02-27 ENCOUNTER — Other Ambulatory Visit: Payer: No Typology Code available for payment source | Admitting: Obstetrics and Gynecology

## 2022-04-17 ENCOUNTER — Other Ambulatory Visit (HOSPITAL_COMMUNITY)
Admission: RE | Admit: 2022-04-17 | Discharge: 2022-04-17 | Disposition: A | Discharge status: Home / Self Care | Payer: No Typology Code available for payment source | Source: Ambulatory Visit | Attending: Obstetrics and Gynecology | Admitting: Obstetrics and Gynecology

## 2022-04-17 ENCOUNTER — Ambulatory Visit (INDEPENDENT_AMBULATORY_CARE_PROVIDER_SITE_OTHER): Payer: No Typology Code available for payment source

## 2022-04-17 ENCOUNTER — Ambulatory Visit (INDEPENDENT_AMBULATORY_CARE_PROVIDER_SITE_OTHER): Payer: No Typology Code available for payment source | Admitting: Obstetrics and Gynecology

## 2022-04-17 ENCOUNTER — Encounter: Payer: Self-pay | Admitting: Obstetrics and Gynecology

## 2022-04-17 VITALS — BP 104/62 | HR 77 | Wt 128.0 lb

## 2022-04-17 DIAGNOSIS — N950 Postmenopausal bleeding: Secondary | ICD-10-CM | POA: Diagnosis present

## 2022-04-17 DIAGNOSIS — N882 Stricture and stenosis of cervix uteri: Secondary | ICD-10-CM | POA: Diagnosis not present

## 2022-04-17 DIAGNOSIS — Z79890 Hormone replacement therapy (postmenopausal): Secondary | ICD-10-CM | POA: Diagnosis not present

## 2022-04-17 NOTE — Progress Notes (Signed)
GYNECOLOGY  VISIT   HPI: 51 y.o.   Married White or Caucasian Not Hispanic or Latino  female   [phone] with Patient's last menstrual period was 01/18/2019 (approximate).   here for evaluation of PMP bleeding on HRT (estrace 0.5 mg tab and prometrium 100 mg cap).She started spotting, then bleeding like a cycle in early May. She bleed for 6 days, it was like or a little more than her normal cycle. She had another episode of bleeding about 3 weeks ago, that lasted 4 days.  Prior menstrual cycle was over 1.5 years prior to that.   GYNECOLOGIC HISTORY: Patient's last menstrual period was 01/18/2019 (approximate). Contraception:NA Menopausal hormone therapy: Yes        OB History     Gravida  1   Para  1   Term  1   Preterm      AB      Living  2      SAB      IAB      Ectopic      Multiple  1   Live Births  2              Patient Active Problem List   Diagnosis Date Noted   Mass of left thigh 01/08/2019   Viral respiratory illness 12/10/2017   Allergic cough 12/10/2017    Past Medical History:  Diagnosis Date   Allergic cough 12/10/2017   Anxiety    Dysmenorrhea    History of pre-eclampsia    History of pulmonary edema    Mass of left thigh 01/08/2019   Viral respiratory illness 12/10/2017    Past Surgical History:  Procedure Laterality Date   BREAST BIOPSY Right    CESAREAN SECTION     COLPOSCOPY  1990   NEG    LIPOMA EXCISION     inner leg     Current Outpatient Medications  Medication Sig Dispense Refill   estradiol (ESTRACE) 0.5 MG tablet Take 1 tablet (0.5 mg total) by mouth daily. 90 tablet 3   FLUoxetine (PROZAC) 20 MG tablet TAKE 1 TABLET BY MOUTH EVERY DAY 90 tablet 3   metroNIDAZOLE (METROCREAM) 0.75 % cream Apply 1 application. topically daily at 12 noon.     progesterone (PROMETRIUM) 100 MG capsule Take 1 capsule (100 mg total) by mouth daily. 90 capsule 3   spironolactone (ALDACTONE) 25 MG tablet TAKE 1 TABLET BY MOUTH EVERY DAY 90  tablet 0   tretinoin (RETIN-A) 0.1 % cream Apply topically at bedtime. 45 g 0   No current facility-administered medications for this visit.     ALLERGIES: Bee pollen, Nsaids, and Penicillins  Family History  Problem Relation Age of Onset   Uterine cancer Paternal Grandmother    Ovarian cysts Mother        Mucinous cysadenoma ovary     Social History   Socioeconomic History   Marital status: Married    Spouse name: Not on file   Number of children: Not on file   Years of education: Not on file   Highest education level: Not on file  Occupational History   Not on file  Tobacco Use   Smoking status: Never   Smokeless tobacco: Never  Vaping Use   Vaping Use: Never used  Substance and Sexual Activity   Alcohol use: Yes    Alcohol/week: 0.0 - 1.0 standard drinks of alcohol   Drug use: Never   Sexual activity: Yes    Partners: Male  Birth control/protection: Other-see comments    Comment: husband had vasectomy   Other Topics Concern   Not on file  Social History Narrative   Not on file   Social Determinants of Health   Financial Resource Strain: Not on file  Food Insecurity: Not on file  Transportation Needs: Not on file  Physical Activity: Not on file  Stress: Not on file  Social Connections: Not on file  Intimate Partner Violence: Not on file    ROS  U/S: see ultrasound report, slightly thickened endometrium up to 4.6 mm by my measurements.   PHYSICAL EXAMINATION:    LMP 01/18/2019 (Approximate)     General appearance: alert, cooperative and appears stated age  Pelvic: External genitalia:  no lesions              Urethra:  normal appearing urethra with no masses, tenderness or lesions              Bartholins and Skenes: normal                 Vagina: normal appearing vagina with normal color and discharge, no lesions              Cervix: no lesions and stenotic  The risks of endometrial biopsy were reviewed and a consent was obtained.  A speculum  was placed in the vagina and the cervix was cleansed with Hibiclens. A tenaculum was placed on the cervix and the cervix was dilated with the os finder and mini-dilator. The pipelle was placed into the endometrial cavity. The uterus sounded to 6-7 cm. The endometrial biopsy was performed, taking care to get a representative sample, sampling 360 degrees of the uterine cavity. Minimal  tissue was obtained. The patient was in severe pain during the procedure and the procedure needed to be stopped. The tenaculum and speculum were removed. There were no complications.                  Chaperone was present for exam.  1. Postmenopausal bleeding Ultrasound with slightly thickened endometrium - Surgical pathology( [place]/ [facility])  2. Cervical stenosis (uterine cervix) Needed to dilate her cervix  3. Hormone replacement therapy (HRT) On daily hrt, depending on biopsy results and recurrent bleeding she may need to change to cyclic hrt

## 2022-04-17 NOTE — Patient Instructions (Signed)

## 2022-04-19 LAB — SURGICAL PATHOLOGY

## 2022-04-24 ENCOUNTER — Other Ambulatory Visit: Payer: Self-pay | Admitting: *Deleted

## 2022-04-24 ENCOUNTER — Encounter: Payer: Self-pay | Admitting: *Deleted

## 2022-04-24 MED ORDER — PROGESTERONE 200 MG PO CAPS: ORAL_CAPSULE | ORAL | 0 refills | Status: DC

## 2022-05-21 DIAGNOSIS — M25551 Pain in right hip: Secondary | ICD-10-CM | POA: Insufficient documentation

## 2022-07-15 ENCOUNTER — Emergency Department (HOSPITAL_BASED_OUTPATIENT_CLINIC_OR_DEPARTMENT_OTHER)
Admission: EM | Admit: 2022-07-15 | Discharge: 2022-07-15 | Disposition: A | Discharge status: Home / Self Care | Payer: No Typology Code available for payment source | Attending: Medical | Admitting: Medical

## 2022-07-15 ENCOUNTER — Emergency Department (HOSPITAL_BASED_OUTPATIENT_CLINIC_OR_DEPARTMENT_OTHER): Payer: No Typology Code available for payment source

## 2022-07-15 ENCOUNTER — Encounter (HOSPITAL_BASED_OUTPATIENT_CLINIC_OR_DEPARTMENT_OTHER): Payer: Self-pay | Admitting: Emergency Medicine

## 2022-07-15 DIAGNOSIS — R519 Headache, unspecified: Secondary | ICD-10-CM | POA: Diagnosis not present

## 2022-07-15 DIAGNOSIS — R200 Anesthesia of skin: Secondary | ICD-10-CM | POA: Insufficient documentation

## 2022-07-15 DIAGNOSIS — Z5321 Procedure and treatment not carried out due to patient leaving prior to being seen by health care provider: Secondary | ICD-10-CM | POA: Insufficient documentation

## 2022-07-15 LAB — CBG MONITORING, ED: Glucose-Capillary: 93 mg/dL (ref 70–99)

## 2022-07-15 MED ORDER — DEXAMETHASONE SODIUM PHOSPHATE 10 MG/ML IJ SOLN: 10.0000 mg | Freq: Once | INTRAMUSCULAR | Status: DC

## 2022-07-15 MED ORDER — SODIUM CHLORIDE 0.9 % IV BOLUS: 1000.0000 mL | Freq: Once | INTRAVENOUS | Status: DC

## 2022-07-15 NOTE — ED Notes (Signed)
PA to triage to evaluate

## 2022-07-15 NOTE — ED Triage Notes (Signed)
Headache x 2 weeks. States L side facial numbness x 2 hours.

## 2022-07-15 NOTE — ED Notes (Signed)
After extensive discussion with pt regarding possible repercussions of not staying ,pt has decided to leave without being seen. She did have MSE done by PA [facility].

## 2022-07-20 ENCOUNTER — Other Ambulatory Visit: Payer: Self-pay | Admitting: Obstetrics and Gynecology

## 2022-07-20 NOTE — Telephone Encounter (Signed)
Last annual exam 07/2021 Scheduled on 08/13/2022 Last mammo 01/2022

## 2022-07-30 IMAGING — MG MM DIGITAL SCREENING BILAT W/ TOMO AND CAD
6 of 10 series · 6 of 30 positions shown · non-contrast
Comparison: Previous exam(s).

CLINICAL DATA: Screening.

EXAM:
DIGITAL SCREENING BILATERAL MAMMOGRAM WITH TOMOSYNTHESIS AND CAD
TECHNIQUE: Bilateral screening digital craniocaudal and mediolateral oblique
mammograms were obtained. Bilateral screening digital breast
tomosynthesis was performed. The images were evaluated with
computer-aided detection.

[R MLO synth-2D (1 of 2)]
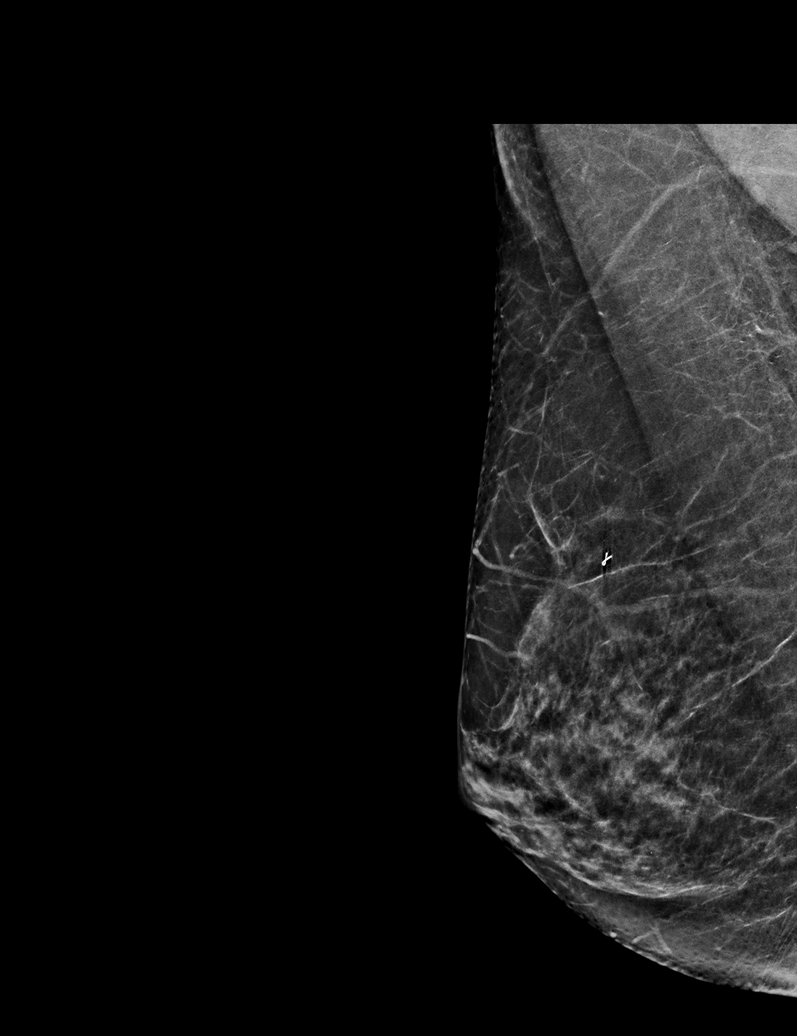

[L CC synth-2D]
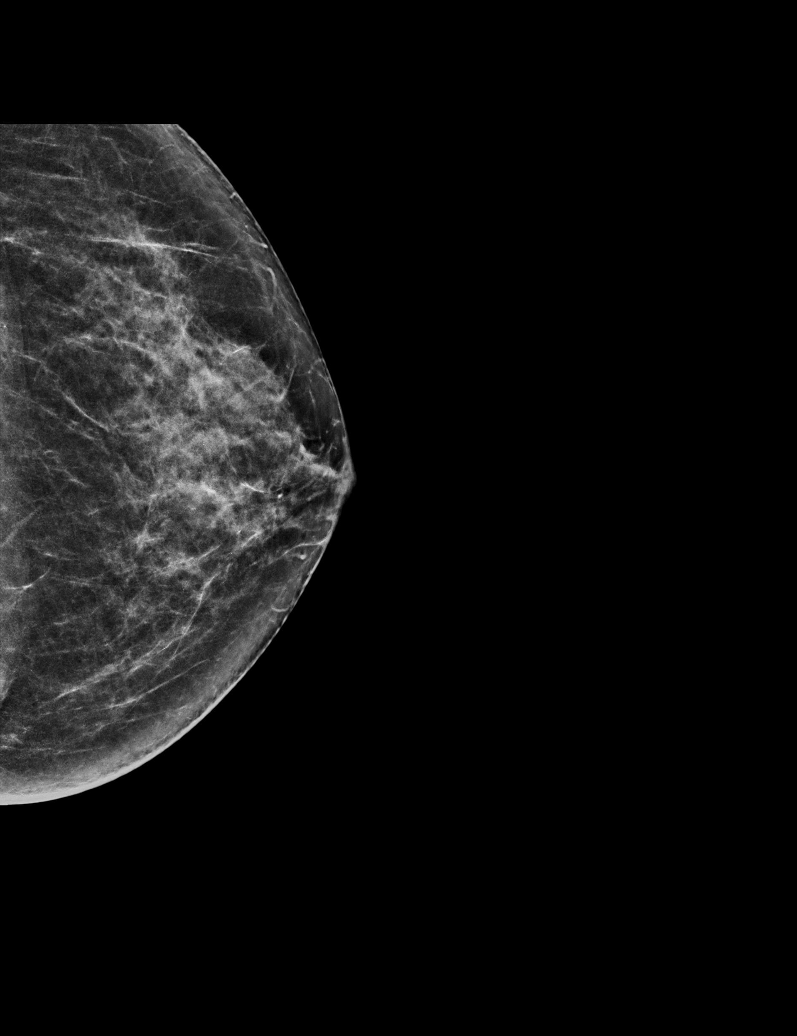

[R MLO synth-2D (2 of 2)]
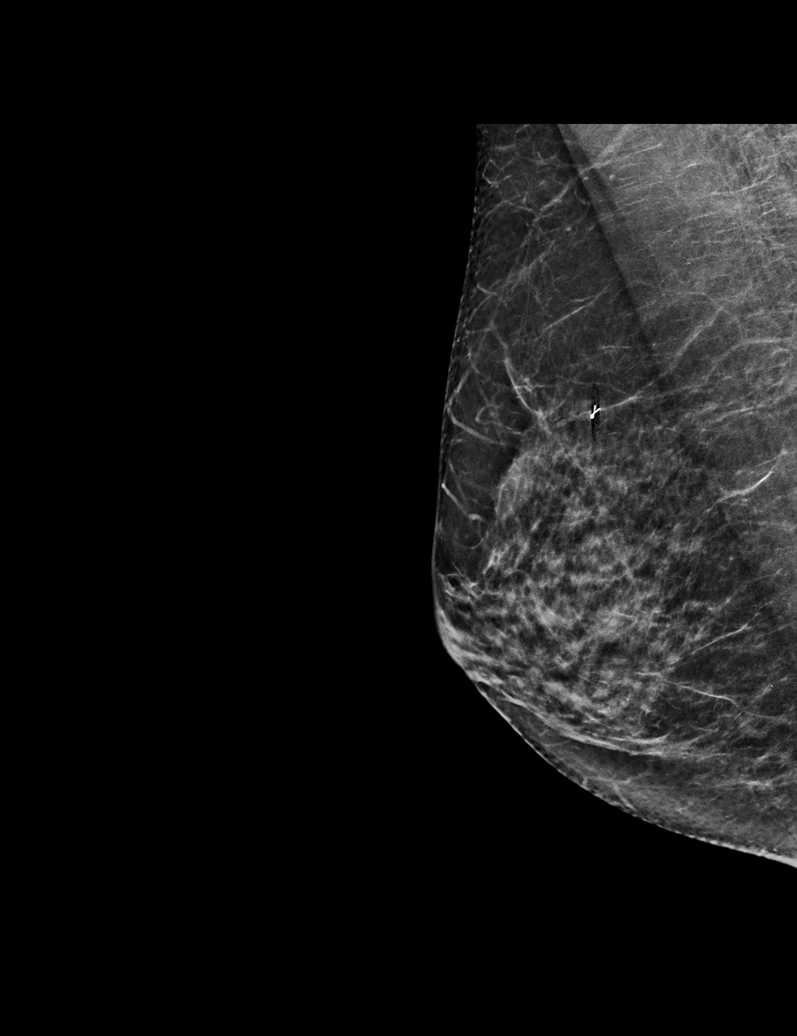

[L MLO synth-2D]
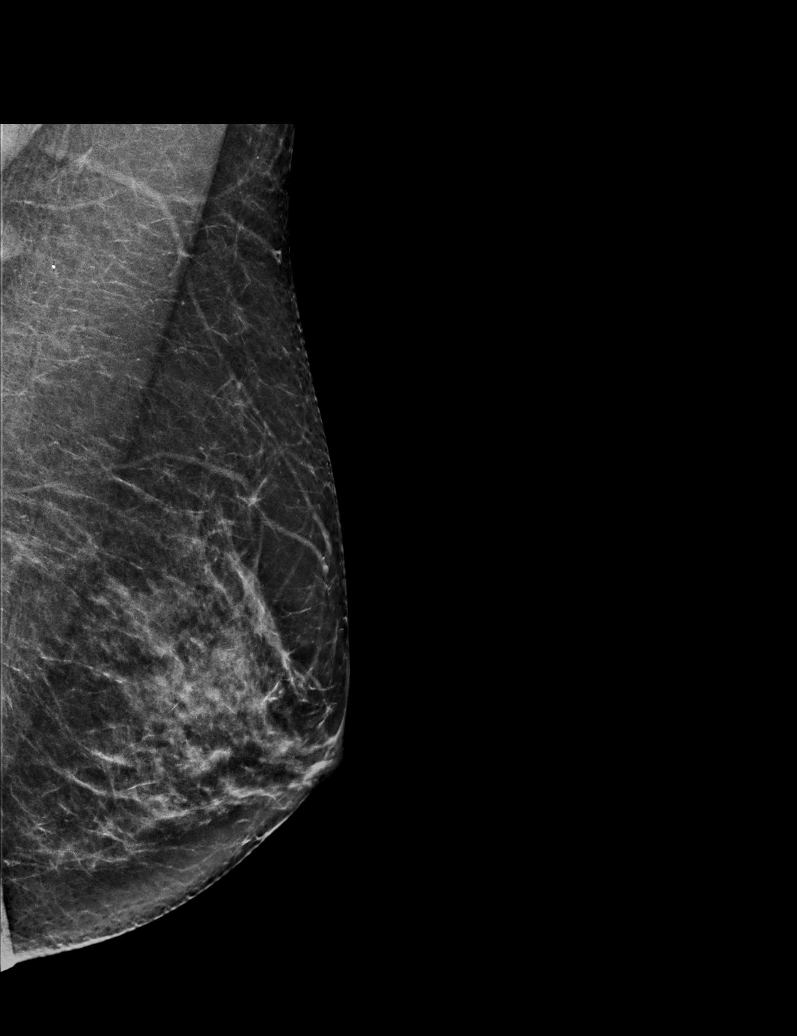

[R CC synth-2D]
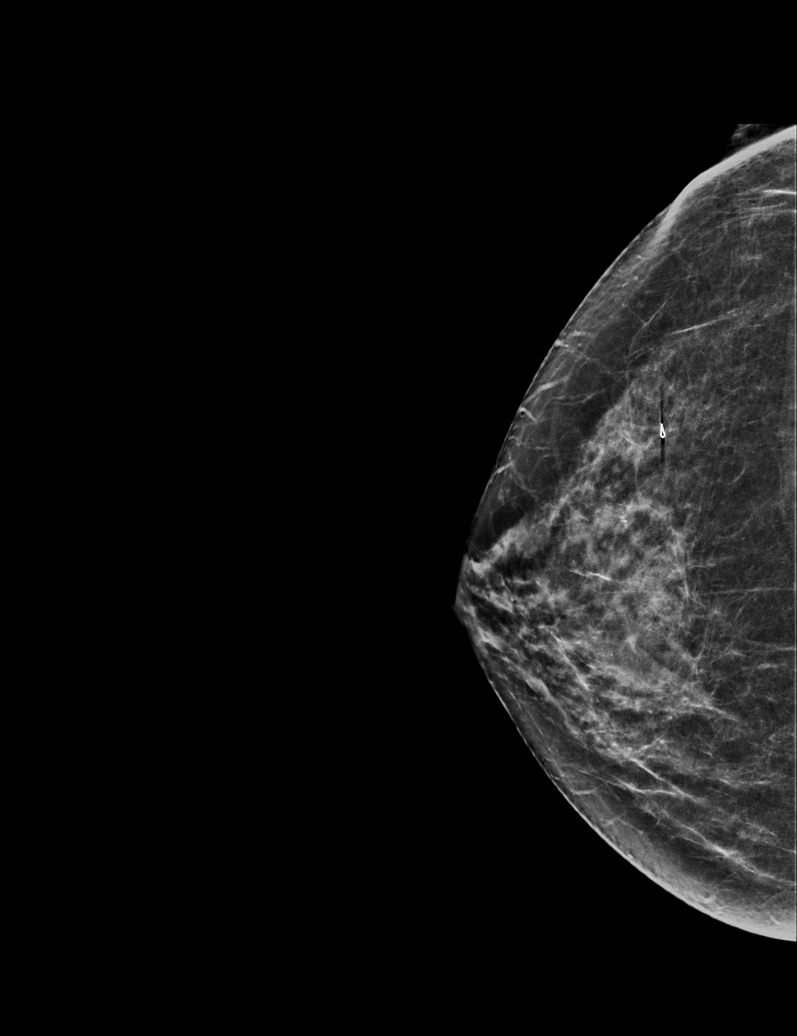

[L CC tomo · tomo slice 29/58.0]
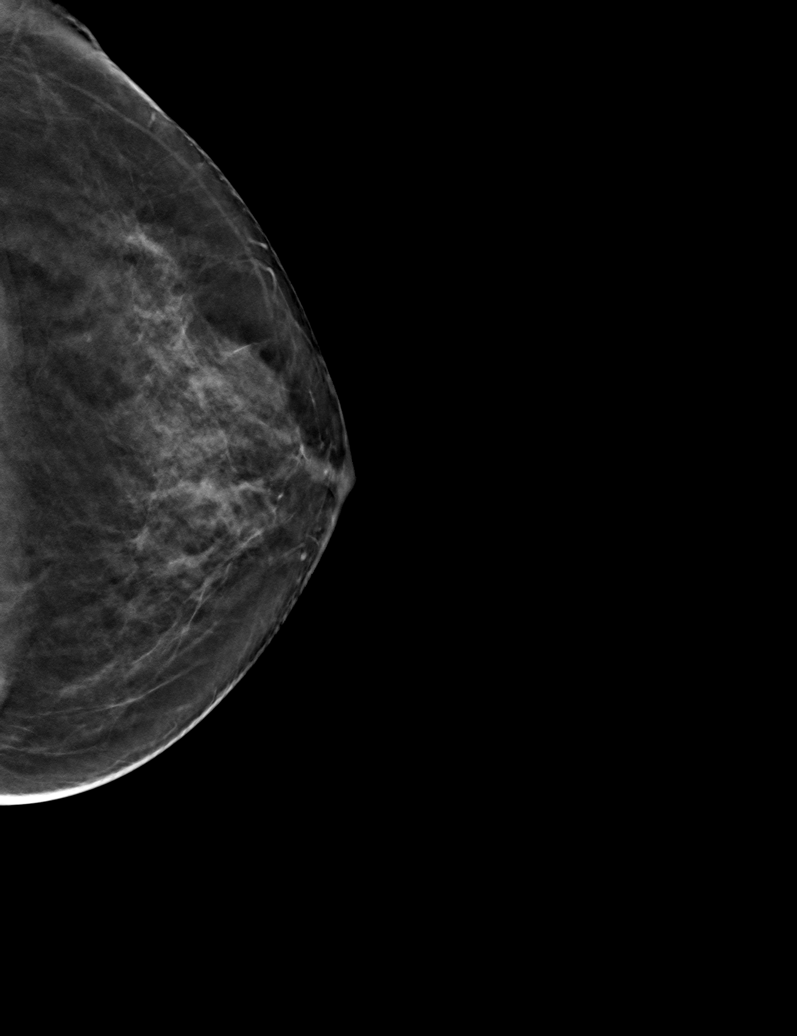

[6 of 30 positions shown; findings below may reference images not displayed]

ACR Breast Density Category c: The breast tissue is heterogeneously
dense, which may obscure small masses.
FINDINGS: There are no findings suspicious for malignancy.
IMPRESSION: No mammographic evidence of malignancy. A result letter of this
screening mammogram will be mailed directly to the patient.

RECOMMENDATION:
Screening mammogram in one year. (Code:Q3-W-BC3)

BI-RADS CATEGORY  1: Negative.

## 2022-08-01 NOTE — Progress Notes (Deleted)
51 y.o. G42P1002 Married White or Caucasian Not Hispanic or Latino female here for annual exam.      Patient's last menstrual period was 01/18/2019 (approximate).          Sexually active: {yes no:314532}  The current method of family planning is {contraception:315051}.    Exercising: {yes no:314532}  {types:19826} Smoker:  {YES P5382123  Health Maintenance: Pap:  02/17/2018 WNL NEG HPV  History of abnormal Pap:    Yes 1990s -colposcopy- neg   MMG:  01/22/22 density C Bi-rads 1 neg  BMD:   n/a Colonoscopy:  in 2021, normal. F/U in 10 years.  TDaP:  07/29/13 Gardasil: n/a   reports that she has never smoked. She has never used smokeless tobacco. She reports current alcohol use. She reports that she does not use drugs.  Past Medical History:  Diagnosis Date   Allergic cough 12/10/2017   Anxiety    Dysmenorrhea    History of pre-eclampsia    History of pulmonary edema    Mass of left thigh 01/08/2019   Viral respiratory illness 12/10/2017    Past Surgical History:  Procedure Laterality Date   BREAST BIOPSY Right    CESAREAN SECTION     COLPOSCOPY  1990   NEG    LIPOMA EXCISION     inner leg     Current Outpatient Medications  Medication Sig Dispense Refill   estradiol (ESTRACE) 0.5 MG tablet Take 1 tablet (0.5 mg total) by mouth daily. 90 tablet 3   FLUoxetine (PROZAC) 20 MG tablet TAKE 1 TABLET BY MOUTH EVERY DAY 90 tablet 3   metroNIDAZOLE (METROCREAM) 0.75 % cream Apply 1 application. topically daily at 12 noon.     progesterone (PROMETRIUM) 200 MG capsule TAKE ONE TABLET BY MOUTH NIGHTLY ON DAYS 1-12 OF THE MONTH 36 capsule 0   spironolactone (ALDACTONE) 25 MG tablet TAKE 1 TABLET BY MOUTH EVERY DAY 90 tablet 0   tretinoin (RETIN-A) 0.1 % cream Apply topically at bedtime. 45 g 0   No current facility-administered medications for this visit.    Family History  Problem Relation Age of Onset   Uterine cancer Paternal Grandmother    Ovarian cysts Mother        Mucinous  cysadenoma ovary     Review of Systems  Exam:   LMP 01/18/2019 (Approximate)   Weight change: '@WEIGHTCHANGE'$ @ Height:      Ht Readings from Last 3 Encounters:  11/23/21 5' 5.5" (1.664 m)  08/07/21 '5\' 6"'$  (1.676 m)  04/22/19 5' 5.5" (1.664 m)    General appearance: alert, cooperative and appears stated age Head: Normocephalic, without obvious abnormality, atraumatic Neck: no adenopathy, supple, symmetrical, trachea midline and thyroid {CHL AMB PHY EX THYROID NORM DEFAULT:[phone]::"normal to inspection and palpation"} Lungs: clear to auscultation bilaterally Cardiovascular: regular rate and rhythm Breasts: {Exam; breast:13139::"normal appearance, no masses or tenderness"} Abdomen: soft, non-tender; non distended,  no masses,  no organomegaly Extremities: extremities normal, atraumatic, no cyanosis or edema Skin: Skin color, texture, turgor normal. No rashes or lesions Lymph nodes: Cervical, supraclavicular, and axillary nodes normal. No abnormal inguinal nodes palpated Neurologic: Grossly normal   Pelvic: External genitalia:  no lesions              Urethra:  normal appearing urethra with no masses, tenderness or lesions              Bartholins and Skenes: normal  Vagina: normal appearing vagina with normal color and discharge, no lesions              Cervix: {CHL AMB PHY EX CERVIX NORM DEFAULT:[phone]::"no lesions"}               Bimanual Exam:  Uterus:  {CHL AMB PHY EX UTERUS NORM DEFAULT:[phone]::"normal size, contour, position, consistency, mobility, non-tender"}              Adnexa: {CHL AMB PHY EX ADNEXA NO MASS DEFAULT:[phone]::"no mass, fullness, tenderness"}               Rectovaginal: Confirms               Anus:  normal sphincter tone, no lesions  *** chaperoned for the exam.  A:  Well Woman with normal exam  P:

## 2022-08-13 ENCOUNTER — Other Ambulatory Visit: Payer: Self-pay | Admitting: Obstetrics and Gynecology

## 2022-08-13 ENCOUNTER — Encounter: Payer: Self-pay | Admitting: Obstetrics and Gynecology

## 2022-08-13 ENCOUNTER — Ambulatory Visit: Payer: No Typology Code available for payment source | Admitting: Obstetrics and Gynecology

## 2022-08-13 DIAGNOSIS — N951 Menopausal and female climacteric states: Secondary | ICD-10-CM

## 2022-08-13 DIAGNOSIS — Z7189 Other specified counseling: Secondary | ICD-10-CM

## 2022-08-13 MED ORDER — ESTRADIOL 0.5 MG PO TABS: 0.5000 mg | ORAL_TABLET | Freq: Every day | ORAL | 0 refills | Status: DC

## 2022-08-13 MED ORDER — PROGESTERONE 200 MG PO CAPS: ORAL_CAPSULE | ORAL | 0 refills | Status: DC

## 2022-08-13 NOTE — Telephone Encounter (Signed)
Please reach out to schedule her for an annual exam.

## 2022-08-22 ENCOUNTER — Other Ambulatory Visit: Payer: Self-pay | Admitting: Obstetrics and Gynecology

## 2022-08-22 DIAGNOSIS — Z7189 Other specified counseling: Secondary | ICD-10-CM

## 2022-08-22 DIAGNOSIS — N951 Menopausal and female climacteric states: Secondary | ICD-10-CM

## 2022-08-22 NOTE — Telephone Encounter (Signed)
Last AEX 08/07/2021--was scheduled for 08/13/2022 but cancelled via VM and no reason was given.   Will send scheduling a msg to try to call and r/s. In the meantime, would you like approve refills so pt wont run out? Please advise.

## 2022-08-23 NOTE — Telephone Encounter (Signed)
Tillie Rung D, CMA Left message to call and schedule

## 2022-08-28 NOTE — Telephone Encounter (Signed)
Katrina Morris, Veneta Penton D, CMA [facility]/Two messages were left for patient to call and schedule appointment; patient did not return our call. Recall has also been sent.

## 2022-09-18 ENCOUNTER — Other Ambulatory Visit: Payer: Self-pay | Admitting: Obstetrics and Gynecology

## 2022-09-18 DIAGNOSIS — N951 Menopausal and female climacteric states: Secondary | ICD-10-CM

## 2022-09-18 NOTE — Telephone Encounter (Signed)
A 90 day supply has been sent. Please ask her to schedule an annual exam.

## 2022-09-18 NOTE — Telephone Encounter (Signed)
Last AEX 08/07/2021--pt was scheduled for 08/13/2022. However, cancelled and has not r/s.   There is currently a recall in pt's chart for AEX for 10/2022.?   Per pharmacy: Requesting a 90 day supply. Please advise.

## 2022-09-18 NOTE — Telephone Encounter (Signed)
Msg sent to appt desk.

## 2022-10-24 NOTE — Progress Notes (Signed)
52 y.o. G42P1002 Married White or Caucasian Not Hispanic or Latino female here for annual exam.  She was evaluated last summer for PMP bleeding on HRT. Endometrial stripe was 4.6 mm, endometrial biopsy inactive. She was switched to cyclic HRT. She didn't have any bleeding until last month, she stopped her progesterone for a few days midway through using it. She had a 7 days cycle, changing a pad 2 x a day. Tolerating cyclic HRT okay. No vasomotor symptoms are controlled. No dyspareunia.  Occasionally leaks a little after voiding, depends on her position. No GSI, no OAB.   No bowel c/o.     Patient's last menstrual period was 01/18/2019 (approximate).          Sexually active: Yes.    The current method of family planning is post menopausal status.    Exercising: Yes.     Weights, cardio  Smoker:  no  Health Maintenance: Pap:  02/17/2018 WNL NEG HPV  History of abnormal Pap:  Yes 1990s -colposcopy- neg   MMG:  01/22/2022 Bi-rads 1 neg  BMD:   none  Colonoscopy: in 2021, normal. F/U in 10 years.  TDaP:  07/29/13 Gardasil: n/a   reports that she has never smoked. She has never used smokeless tobacco. She reports current alcohol use. She reports that she does not use drugs. 2 gasses of wine a week. 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 Twins girls are 70  Past Medical History:  Diagnosis Date   Allergic cough 12/10/2017   Anxiety    Dysmenorrhea    History of pre-eclampsia    History of pulmonary edema    Mass of left thigh 01/08/2019   Viral respiratory illness 12/10/2017    Past Surgical History:  Procedure Laterality Date   BREAST BIOPSY Right    CESAREAN SECTION     COLPOSCOPY  1990   NEG    LIPOMA EXCISION     inner leg     Current Outpatient Medications  Medication Sig Dispense Refill   estradiol (ESTRACE) 0.5 MG tablet Take 1 tablet (0.5 mg total) by mouth daily. 90 tablet 0   FLUoxetine (PROZAC) 20 MG tablet TAKE 1 TABLET BY MOUTH EVERY DAY 90 tablet 0    metroNIDAZOLE (METROCREAM) 0.75 % cream Apply 1 application. topically daily at 12 noon.     progesterone (PROMETRIUM) 200 MG capsule TAKE ONE TABLET BY MOUTH NIGHTLY ON DAYS 1-12 OF THE MONTH 36 capsule 0   spironolactone (ALDACTONE) 25 MG tablet TAKE 1 TABLET BY MOUTH EVERY DAY 90 tablet 0   tretinoin (RETIN-A) 0.1 % cream Apply topically at bedtime. 45 g 0   No current facility-administered medications for this visit.    Family History  Problem Relation Age of Onset   Uterine cancer Paternal Grandmother    Ovarian cysts Mother        Mucinous cysadenoma ovary     Review of Systems  Genitourinary:  Positive for vaginal bleeding.       Vaginal bleeding 2/10 to 2/15  All other systems reviewed and are negative.   Exam:   BP 118/82   Pulse 66   Resp 12   Ht 5' 4.75" (1.645 m)   Wt 132 lb (59.9 kg)   LMP 01/18/2019 (Approximate)   BMI 22.14 kg/m   Weight change: @WEIGHTCHANGE$ @ Height:   Height: 5' 4.75" (164.5 cm)  Ht Readings from Last 3 Encounters:  11/01/22 5' 4.75" (1.645 m)  11/23/21 5' 5.5" (1.664 m)  08/07/21 5' 6"$  (1.676 m)    General appearance: alert, cooperative and appears stated age Head: Normocephalic, without obvious abnormality, atraumatic Neck: no adenopathy, supple, symmetrical, trachea midline and thyroid normal to inspection and palpation Lungs: clear to auscultation bilaterally Cardiovascular: regular rate and rhythm Breasts: normal appearance, no masses or tenderness Abdomen: soft, non-tender; non distended,  no masses,  no organomegaly Extremities: extremities normal, atraumatic, no cyanosis or edema Skin: Skin color, texture, turgor normal. No rashes or lesions Lymph nodes: Cervical, supraclavicular, and axillary nodes normal. No abnormal inguinal nodes palpated Neurologic: Grossly normal   Pelvic: External genitalia:  no lesions              Urethra:  normal appearing urethra with no masses, tenderness or lesions              Bartholins and  Skenes: normal                 Vagina: normal appearing vagina with normal color and discharge, no lesions              Cervix: no lesions               Bimanual Exam:  Uterus:  normal size, contour, position, consistency, mobility, non-tender              Adnexa: no mass, fullness, tenderness               Rectovaginal: Confirms               Anus:  normal sphincter tone, no lesions  Gae Dry, CMA chaperoned for the exam.  1. Well woman exam Discussed breast self exam Discussed calcium and vit D intake Mammogram due in 5/24 Colonoscopy is UTD Labs and immunizations with primary  2. Screening for cervical cancer - Cytology - PAP  3. Hormone replacement therapy (HRT) Doing well, will continue cyclic HRT for now. - estradiol (ESTRACE) 0.5 MG tablet; Take 1 tablet (0.5 mg total) by mouth daily.  Dispense: 90 tablet; Refill: 3 - progesterone (PROMETRIUM) 200 MG capsule; TAKE ONE TABLET BY MOUTH NIGHTLY ON DAYS 1-12 OF THE MONTH  Dispense: 36 capsule; Refill: 3  4. Acne, unspecified acne type - spironolactone (ALDACTONE) 25 MG tablet; Take 1 tablet (25 mg total) by mouth daily.  Dispense: 90 tablet; Refill: 3  5. History of anxiety Doing well - FLUoxetine (PROZAC) 20 MG tablet; Take 1 tablet (20 mg total) by mouth daily.  Dispense: 90 tablet; Refill: 3

## 2022-11-01 ENCOUNTER — Other Ambulatory Visit (HOSPITAL_COMMUNITY)
Admission: RE | Admit: 2022-11-01 | Discharge: 2022-11-01 | Disposition: A | Discharge status: Home / Self Care | Payer: No Typology Code available for payment source | Source: Ambulatory Visit | Attending: Obstetrics and Gynecology | Admitting: Obstetrics and Gynecology

## 2022-11-01 ENCOUNTER — Ambulatory Visit (INDEPENDENT_AMBULATORY_CARE_PROVIDER_SITE_OTHER): Payer: No Typology Code available for payment source | Admitting: Obstetrics and Gynecology

## 2022-11-01 ENCOUNTER — Encounter: Payer: Self-pay | Admitting: Obstetrics and Gynecology

## 2022-11-01 VITALS — BP 118/82 | HR 66 | Resp 12 | Ht 64.75 in | Wt 132.0 lb

## 2022-11-01 DIAGNOSIS — Z124 Encounter for screening for malignant neoplasm of cervix: Secondary | ICD-10-CM | POA: Diagnosis present

## 2022-11-01 DIAGNOSIS — Z01419 Encounter for gynecological examination (general) (routine) without abnormal findings: Secondary | ICD-10-CM | POA: Diagnosis not present

## 2022-11-01 DIAGNOSIS — Z79890 Hormone replacement therapy (postmenopausal): Secondary | ICD-10-CM | POA: Diagnosis not present

## 2022-11-01 DIAGNOSIS — L709 Acne, unspecified: Secondary | ICD-10-CM

## 2022-11-01 DIAGNOSIS — Z8659 Personal history of other mental and behavioral disorders: Secondary | ICD-10-CM | POA: Diagnosis not present

## 2022-11-01 MED ORDER — PROGESTERONE 200 MG PO CAPS: ORAL_CAPSULE | ORAL | 3 refills | Status: DC

## 2022-11-01 MED ORDER — SPIRONOLACTONE 25 MG PO TABS: 25.0000 mg | ORAL_TABLET | Freq: Every day | ORAL | 3 refills | Status: DC

## 2022-11-01 MED ORDER — ESTRADIOL 0.5 MG PO TABS: 0.5000 mg | ORAL_TABLET | Freq: Every day | ORAL | 3 refills | Status: DC

## 2022-11-01 MED ORDER — FLUOXETINE HCL 20 MG PO TABS: 20.0000 mg | ORAL_TABLET | Freq: Every day | ORAL | 3 refills | Status: DC

## 2022-11-05 LAB — CYTOLOGY - PAP
Adequacy: ABSENT
Comment: NEGATIVE
Diagnosis: NEGATIVE
High risk HPV: NEGATIVE

## 2023-06-27 ENCOUNTER — Encounter: Payer: No Typology Code available for payment source | Admitting: Vascular Surgery

## 2023-07-25 ENCOUNTER — Encounter: Payer: No Typology Code available for payment source | Admitting: Vascular Surgery

## 2023-08-27 ENCOUNTER — Other Ambulatory Visit: Payer: Self-pay | Admitting: Obstetrics and Gynecology

## 2023-08-27 DIAGNOSIS — Z1231 Encounter for screening mammogram for malignant neoplasm of breast: Secondary | ICD-10-CM

## 2023-08-29 ENCOUNTER — Ambulatory Visit (INDEPENDENT_AMBULATORY_CARE_PROVIDER_SITE_OTHER): Payer: No Typology Code available for payment source | Admitting: Obstetrics and Gynecology

## 2023-08-29 VITALS — BP 118/70 | HR 79 | Ht 67.25 in | Wt 136.0 lb

## 2023-08-29 DIAGNOSIS — N951 Menopausal and female climacteric states: Secondary | ICD-10-CM

## 2023-08-29 DIAGNOSIS — N644 Mastodynia: Secondary | ICD-10-CM

## 2023-08-29 DIAGNOSIS — R202 Paresthesia of skin: Secondary | ICD-10-CM | POA: Diagnosis not present

## 2023-08-29 DIAGNOSIS — N926 Irregular menstruation, unspecified: Secondary | ICD-10-CM | POA: Diagnosis not present

## 2023-08-29 NOTE — Progress Notes (Signed)
52 y.o. G61P1002 female on HRT (estradiol plus cyclical progesterone) here for irregular bleeding and breast tenderness.  Patient's last menstrual period was 01/18/2019 (approximate).   Pt states she had endo bx with JJ in August 2023 and has since had some vaginal bleeding.   Few episodes of bleeding this year.  Light spotting. When she is spotting, it lasts for 2-3 days. The last time was one week ago. Symptoms are currently gone as of today. Cramping with bleeding at times..   Pain in right breast would like it examined today.   States her right labia is sometimes numb.  No other areas involved.  History of trauma.  Denies urine and bowel incontinence.  Last mammogram: 2023, due  GYN HISTORY: No significant history  OB History  Gravida Para Term Preterm AB Living  1 1 1   2   SAB IAB Ectopic Multiple Live Births     1 2    # Outcome Date GA Lbr Len/2nd Weight Sex Type Anes PTL Lv  1A Term      CS-Unspec   LIV  1B Term      CS-Unspec   LIV    Past Medical History:  Diagnosis Date   Allergic cough 12/10/2017   Anxiety    Dysmenorrhea    History of pre-eclampsia    History of pulmonary edema    Mass of left thigh 01/08/2019   Viral respiratory illness 12/10/2017    Past Surgical History:  Procedure Laterality Date   BREAST BIOPSY Right    CESAREAN SECTION     COLPOSCOPY  1990   NEG    LIPOMA EXCISION     inner leg     Current Outpatient Medications on File Prior to Visit  Medication Sig Dispense Refill   estradiol (ESTRACE) 0.5 MG tablet Take 1 tablet (0.5 mg total) by mouth daily. 90 tablet 3   FLUoxetine (PROZAC) 20 MG tablet Take 1 tablet (20 mg total) by mouth daily. 90 tablet 3   metroNIDAZOLE (METROGEL) 1 % gel SMARTSIG:1 sparingly Topical Daily     progesterone (PROMETRIUM) 200 MG capsule TAKE ONE TABLET BY MOUTH NIGHTLY ON DAYS 1-12 OF THE MONTH 36 capsule 3   spironolactone (ALDACTONE) 25 MG tablet Take 1 tablet (25 mg total) by mouth daily. 90 tablet 3    tretinoin (RETIN-A) 0.1 % cream Apply topically at bedtime. 45 g 0   No current facility-administered medications on file prior to visit.    Allergies  Allergen Reactions   Penicillins Hives and Rash   Bee Pollen    Nsaids Other (See Comments)    GI upset      PE Today's Vitals   08/29/23 1413  BP: 118/70  Pulse: 79  SpO2: 99%  Weight: 136 lb (61.7 kg)  Height: 5' 7.25" (1.708 m)   Body mass index is 21.14 kg/m.  Physical Exam Vitals reviewed. Exam conducted with a chaperone present.  Constitutional:      General: She is not in acute distress.    Appearance: Normal appearance.  HENT:     Head: Normocephalic and atraumatic.     Nose: Nose normal.  Eyes:     Extraocular Movements: Extraocular movements intact.     Conjunctiva/sclera: Conjunctivae normal.  Neck:     Thyroid: No thyroid mass, thyromegaly or thyroid tenderness.  Pulmonary:     Effort: Pulmonary effort is normal.  Chest:     Chest wall: No mass or tenderness.  Breasts:  Right: Normal. No swelling, mass, nipple discharge, skin change or tenderness.     Left: Normal. No swelling, mass, nipple discharge, skin change or tenderness.       Comments: Tenderness of right breast at 9:00 approximately 1 cm from the nipple, no mass palpated Abdominal:     General: There is no distension.     Palpations: Abdomen is soft.     Tenderness: There is no abdominal tenderness.  Genitourinary:    General: Normal vulva.     Exam position: Lithotomy position.     Urethra: No prolapse.     Vagina: Normal. No vaginal discharge or bleeding.     Cervix: Normal. No lesion.     Uterus: Normal. Not enlarged and not tender.      Adnexa: Right adnexa normal and left adnexa normal.     Comments: Positive anal wink reflex bilaterally Normal strength Kegel Musculoskeletal:        General: Normal range of motion.     Cervical back: Normal range of motion.  Lymphadenopathy:     Upper Body:     Right upper body: No  axillary adenopathy.     Left upper body: No axillary adenopathy.     Lower Body: No right inguinal adenopathy. No left inguinal adenopathy.  Skin:    General: Skin is warm and dry.  Neurological:     General: No focal deficit present.     Mental Status: She is alert.  Psychiatric:        Mood and Affect: Mood normal.        Behavior: Behavior normal.       Assessment and Plan:        Breast tenderness Assessment & Plan: Will also add on diagnostic breast imaging giving breast tenderness Discussed that symptoms could be related to hormonal cycling  Orders: -     MM 3D DIAGNOSTIC MAMMOGRAM UNILATERAL RIGHT BREAST; Future -     [place] LIMITED ULTRASOUND INCLUDING AXILLA RIGHT BREAST; Future  Irregular bleeding Assessment & Plan: Patient premenopausal when she started HRT in 2019 Currently on cyclical progesterone, reviewed that occasional bleeding could be normal given perimenopausal status and progesterone regimen. Will check FSH levels to determine menopausal status Interval ultrasound as last biopsy was greater than 1 year ago Endometrial sampling pending FSH and ultrasound results.  Orders: -     Follicle stimulating hormone -     Estradiol -     US PELVIS TRANSVAGINAL NON-OB (TV ONLY); Future  Perimenopause -     Follicle stimulating hormone -     Estradiol  Paresthesia of saddle area  Discussed no obvious abnormalities of right vulva and perineum Reflexes and strength intact. Recommend pelvic relaxation techniques and stretching. Could consider neurology referral, however patient declined at this time.  Rosalyn Gess, MD

## 2023-08-30 ENCOUNTER — Encounter: Payer: Self-pay | Admitting: Obstetrics and Gynecology

## 2023-08-30 DIAGNOSIS — N951 Menopausal and female climacteric states: Secondary | ICD-10-CM | POA: Insufficient documentation

## 2023-08-30 DIAGNOSIS — N950 Postmenopausal bleeding: Secondary | ICD-10-CM | POA: Insufficient documentation

## 2023-08-30 DIAGNOSIS — N926 Irregular menstruation, unspecified: Secondary | ICD-10-CM | POA: Insufficient documentation

## 2023-08-30 DIAGNOSIS — N644 Mastodynia: Secondary | ICD-10-CM | POA: Insufficient documentation

## 2023-08-30 LAB — ESTRADIOL: Estradiol: 29 pg/mL

## 2023-08-30 LAB — FOLLICLE STIMULATING HORMONE: FSH: 75.9 m[IU]/mL

## 2023-08-30 NOTE — Assessment & Plan Note (Signed)
Will also add on diagnostic breast imaging giving breast tenderness Discussed that symptoms could be related to hormonal cycling

## 2023-08-30 NOTE — Assessment & Plan Note (Addendum)
Patient premenopausal when she started HRT in 2019 Currently on cyclical progesterone, reviewed that occasional bleeding could be normal given perimenopausal status and progesterone regimen. Will check FSH levels to determine menopausal status Interval ultrasound as last biopsy was greater than 1 year ago Endometrial sampling pending FSH and ultrasound results.

## 2023-09-16 ENCOUNTER — Telehealth: Payer: Self-pay | Admitting: *Deleted

## 2023-09-16 DIAGNOSIS — N644 Mastodynia: Secondary | ICD-10-CM

## 2023-09-16 NOTE — Telephone Encounter (Signed)
 Call returned to patient. Patient states she is trying to schedule Dx MMG, screening cancelled, no orders.   Per review of EPIC, patient has Screening MMG order, Dx Right breast MMG and US  at [facility].   Order cancelled for screening MMG and right breast Dx MMG. New order placed for Bilateral Dx MMG and Right breast US . Patient will return call to [facility] to schedule, as she has a specific schedule.    Per review of EPIC, previous imaging at [facility], returned call to patient to confirm location of imaging, patient request to schedule at [facility]. Advised confirmed with TBC orders placed and can be viewed for scheduling. Patient appreciative of call.   Routing to provider for final review. Patient is agreeable to disposition. Will close encounter.

## 2023-09-19 ENCOUNTER — Ambulatory Visit (INDEPENDENT_AMBULATORY_CARE_PROVIDER_SITE_OTHER): Payer: No Typology Code available for payment source | Admitting: Obstetrics and Gynecology

## 2023-09-19 ENCOUNTER — Ambulatory Visit: Payer: No Typology Code available for payment source

## 2023-09-19 ENCOUNTER — Ambulatory Visit (INDEPENDENT_AMBULATORY_CARE_PROVIDER_SITE_OTHER): Payer: No Typology Code available for payment source

## 2023-09-19 ENCOUNTER — Encounter: Payer: Self-pay | Admitting: Obstetrics and Gynecology

## 2023-09-19 VITALS — BP 116/72 | HR 78

## 2023-09-19 DIAGNOSIS — N950 Postmenopausal bleeding: Secondary | ICD-10-CM

## 2023-09-19 DIAGNOSIS — N644 Mastodynia: Secondary | ICD-10-CM

## 2023-09-19 DIAGNOSIS — Z79890 Hormone replacement therapy (postmenopausal): Secondary | ICD-10-CM | POA: Diagnosis not present

## 2023-09-19 DIAGNOSIS — N926 Irregular menstruation, unspecified: Secondary | ICD-10-CM

## 2023-09-19 DIAGNOSIS — N958 Other specified menopausal and perimenopausal disorders: Secondary | ICD-10-CM | POA: Insufficient documentation

## 2023-09-19 MED ORDER — PROGESTERONE MICRONIZED 100 MG PO CAPS: 100.0000 mg | ORAL_CAPSULE | Freq: Every day | ORAL | 3 refills | Status: DC

## 2023-09-19 MED ORDER — ESTRADIOL 0.1 MG/GM VA CREA: TOPICAL_CREAM | VAGINAL | 1 refills | Status: DC

## 2023-09-19 NOTE — Progress Notes (Signed)
 53 y.o. G74P1002 female on HRT (estradiol  plus cyclical progesterone , had rash with patch) here for TVUS for postmenopausal bleeding. Married, vasectomy. 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  Patient's last menstrual period was 01/18/2019.   At 08/29/2023 appointment, she noted: Pt states she had endo bx with JJ in August 2023 and has since had some vaginal bleeding.  Few episodes of bleeding this year.  Light spotting. When she is spotting, it lasts for 2-3 days. The last time was one week ago. Symptoms are currently gone as of today. Cramping with bleeding at times..  Pain in right breast would like it examined today.  States her right labia is sometimes numb.  No other areas involved.  History of trauma.  Denies urine and bowel incontinence.  Today she reports intermittent hot flashes at nighttime.  She started taking progesterone  nightly.  Last mammogram: 2023, due  GYN HISTORY: No significant history  OB History  Gravida Para Term Preterm AB Living  1 1 1   2   SAB IAB Ectopic Multiple Live Births     1 2    # Outcome Date GA Lbr Len/2nd Weight Sex Type Anes PTL Lv  1A Term      CS-Unspec   LIV  1B Term      CS-Unspec   LIV    Past Medical History:  Diagnosis Date   Allergic cough 12/10/2017   Anxiety    Dysmenorrhea    History of pre-eclampsia    History of pulmonary edema    Mass of left thigh 01/08/2019   Viral respiratory illness 12/10/2017    Past Surgical History:  Procedure Laterality Date   BREAST BIOPSY Right    CESAREAN SECTION     COLPOSCOPY  1990   NEG    LIPOMA EXCISION     inner leg     Current Outpatient Medications on File Prior to Visit  Medication Sig Dispense Refill   estradiol  (ESTRACE ) 0.5 MG tablet Take 1 tablet (0.5 mg total) by mouth daily. 90 tablet 3   FLUoxetine  (PROZAC ) 20 MG tablet Take 1 tablet (20 mg total) by mouth daily. 90 tablet 3   metroNIDAZOLE (METROGEL) 1 % gel SMARTSIG:1 sparingly Topical Daily      spironolactone  (ALDACTONE ) 25 MG tablet Take 1 tablet (25 mg total) by mouth daily. 90 tablet 3   tretinoin (RETIN-A) 0.1 % cream Apply topically at bedtime. 45 g 0   No current facility-administered medications on file prior to visit.    Allergies  Allergen Reactions   Penicillins Hives and Rash   Bee Pollen    Nsaids Other (See Comments)    GI upset      PE Today's Vitals   09/19/23 1526  BP: 116/72  Pulse: 78  SpO2: 99%    There is no height or weight on file to calculate BMI.  Physical Exam Vitals reviewed.  Constitutional:      General: She is not in acute distress.    Appearance: Normal appearance.  HENT:     Head: Normocephalic and atraumatic.     Nose: Nose normal.  Eyes:     Extraocular Movements: Extraocular movements intact.     Conjunctiva/sclera: Conjunctivae normal.  Pulmonary:     Effort: Pulmonary effort is normal.  Musculoskeletal:        General: Normal range of motion.     Cervical back: Normal range of motion.  Neurological:     General: No focal deficit  present.     Mental Status: She is alert.  Psychiatric:        Mood and Affect: Mood normal.        Behavior: Behavior normal.     09/19/2023 TVUS:  Indications: Abnormal uterine bleeding  Findings:   Uterus: 7.0 x 1.2 x 3.4 cm, anteverted. Endometrial thickness: 3 mm Left ovary: 1.7 x 0.8 x 0.5 cm, 0.4 cm.  Normal-appearing Right ovary: 2.3 x 1.2 x 1.5 cm, 2.1 cm, normal-appearing. No free fluid.  Impression:  Normal transvaginal ultrasound  Vera LULLA Pa, MD  Assessment and Plan:        Postmenopausal bleeding Assessment & Plan: [facility] elevated Patient noting vasomotor symptoms and PMB on HRT. Patient with recurrent, intermittent postmenopausal bleeding, last endometrial biopsy in 2023 was benign.  She is currently taking cyclical progesterone  with oral estrogen. Reviewed results of ultrasound today with normal endometrial thickness. No further workup for postmenopausal  bleeding is recommended at this time. However I would recommend transitioning to continuous progesterone  therapy, and I would recommend vaginal estrogen to address vulvar changes related to menopause.  Patient in agreement with plan. Follow-up symptoms at annual exam. If bleeding persist, I would recommend diagnostic hysteroscopy and D&C   Hormone replacement therapy (HRT) -     Progesterone ; Take 1 capsule (100 mg total) by mouth at bedtime.  Dispense: 90 capsule; Refill: 3  Genitourinary syndrome of menopause Assessment & Plan: Reviewed safety profile of low dose vaginal estrogen, however reviewed that higher doses have been associated with DVT, breast and uterine cancer.    Orders: -     Estradiol ; Apply 1/2 gram to vulva nightly for 2 weeks then decrease to 1/2 gram to vulva two nights a week.  Dispense: 42.5 g; Refill: 1  Breast tenderness Assessment & Plan: Mammogram scheduled 09/26/2023.      Vera LULLA Pa, MD

## 2023-09-19 NOTE — Assessment & Plan Note (Signed)
 Patient noting baser motor symptoms and irregular bleeding on HRT.

## 2023-09-19 NOTE — Assessment & Plan Note (Signed)
Reviewed safety profile of low dose vaginal estrogen, however reviewed that higher doses have been associated with DVT, breast and uterine cancer.

## 2023-09-19 NOTE — Assessment & Plan Note (Signed)
 Mammogram scheduled 09/26/2023.

## 2023-09-19 NOTE — Assessment & Plan Note (Addendum)
 FSH elevated Patient noting vasomotor symptoms and PMB on HRT. Patient with recurrent, intermittent postmenopausal bleeding, last endometrial biopsy in 2023 was benign.  She is currently taking cyclical progesterone  with oral estrogen. Reviewed results of ultrasound today with normal endometrial thickness. No further workup for postmenopausal bleeding is recommended at this time. However I would recommend transitioning to continuous progesterone  therapy, and I would recommend vaginal estrogen to address vulvar changes related to menopause.  Patient in agreement with plan. Follow-up symptoms at annual exam. If bleeding persist, I would recommend diagnostic hysteroscopy and D&C

## 2023-09-26 ENCOUNTER — Other Ambulatory Visit: Payer: No Typology Code available for payment source

## 2023-10-14 ENCOUNTER — Encounter: Payer: Self-pay | Admitting: Obstetrics and Gynecology

## 2023-10-14 ENCOUNTER — Ambulatory Visit
Admission: RE | Admit: 2023-10-14 | Discharge: 2023-10-14 | Disposition: A | Discharge status: Home / Self Care | Payer: No Typology Code available for payment source | Source: Ambulatory Visit | Attending: Obstetrics and Gynecology

## 2023-10-14 DIAGNOSIS — N644 Mastodynia: Secondary | ICD-10-CM

## 2023-11-07 ENCOUNTER — Ambulatory Visit: Payer: No Typology Code available for payment source | Admitting: Obstetrics and Gynecology

## 2024-01-06 ENCOUNTER — Encounter: Payer: Self-pay | Admitting: Obstetrics and Gynecology

## 2024-01-06 ENCOUNTER — Other Ambulatory Visit: Payer: Self-pay

## 2024-01-06 DIAGNOSIS — Z79890 Hormone replacement therapy (postmenopausal): Secondary | ICD-10-CM

## 2024-01-06 MED ORDER — ESTRADIOL 0.5 MG PO TABS: 0.5000 mg | ORAL_TABLET | Freq: Every day | ORAL | 0 refills | Status: DC

## 2024-01-06 NOTE — Telephone Encounter (Signed)
 Medication refill request: estradiol  tablet  Last AEX:  11/01/22 Next AEX: not yet scheduled  Last MMG (if hormonal medication request): 10/20/23 bi-rads 1 neg  Refill authorized: please advise

## 2024-02-10 ENCOUNTER — Encounter: Payer: Self-pay | Admitting: Obstetrics and Gynecology

## 2024-02-10 DIAGNOSIS — Z8659 Personal history of other mental and behavioral disorders: Secondary | ICD-10-CM

## 2024-02-10 NOTE — Telephone Encounter (Signed)
 Med refill request:Prozac  20 mg tab po daily Last AEX: 11/01/22 JJ; OV [facility] 09/19/23 Next AEX: Not scheduled, MyChart message sent Last MMG (if hormonal med) N/A Refill authorized: Please Advise?

## 2024-02-11 MED ORDER — FLUOXETINE HCL 20 MG PO TABS: 20.0000 mg | ORAL_TABLET | Freq: Every day | ORAL | 0 refills | Status: DC

## 2024-04-02 ENCOUNTER — Other Ambulatory Visit: Payer: Self-pay | Admitting: *Deleted

## 2024-04-02 ENCOUNTER — Other Ambulatory Visit: Payer: Self-pay | Admitting: Obstetrics and Gynecology

## 2024-04-02 DIAGNOSIS — Z79890 Hormone replacement therapy (postmenopausal): Secondary | ICD-10-CM

## 2024-04-02 NOTE — Telephone Encounter (Signed)
 Medication refill request: estradiol  0.5mg  tablet Last AEX:  11-01-22 Next AEX: not scheduled. Message sent to scheduling department to call her to schedule Last MMG (if hormonal medication request): 10-14-23 bilateral & rt breast u/s birads 1:neg Refill authorized: please approve or deny as appropriate

## 2024-04-02 NOTE — Telephone Encounter (Signed)
 Patient left message on triage line requesting refill of estradiol .   Per review of refill encounter dated 04/02/24, Rx sent.   Call placed to patient, left message advising Rx sent, keep AEX for further refills.   Encounter closed.

## 2024-04-30 NOTE — Progress Notes (Signed)
 53 y.o. G10P1002 female on HRT with acne (spironolactone ), anxiety (prozac ) here for annual exam. Married. FM MD. Daughters are both at college [facility] and G.  Patient's last menstrual period was 01/18/2019.   She reports no other concerns today. Has been on prozac  since she had her children. Her mood is stable. Has occasional acne, wants to stay on spironolactone  Prior concerns mentioned at 08/29/23 appt: breast tenderness, PMB, right labial numbness have resolved. Has appt with GI today for GERD and PCP for annual on Thursday.  Urine sample provided: No  Abnormal bleeding: none Pelvic discharge or pain: none Breast mass, nipple discharge or skin changes : none  Sexually active: Yes Birth control: Vasectomy Last PAP:     Component Value Date/Time   DIAGPAP  11/01/2022 1558    - Negative for intraepithelial lesion or malignancy (NILM)   DIAGPAP  02/17/2018 0000    NEGATIVE FOR INTRAEPITHELIAL LESIONS OR MALIGNANCY.   DIAGPAP  02/17/2018 0000    ENDOMETRIAL CELLS PRESENT AND CORRELATES WITH MENSTRUAL HISTORY PROVIDED.   HPVHIGH Negative 11/01/2022 1558   ADEQPAP  11/01/2022 1558    Satisfactory for evaluation; transformation zone component ABSENT.   ADEQPAP  02/17/2018 0000    Satisfactory for evaluation  endocervical/transformation zone component PRESENT.   Last mammogram: 10/14/23 density C, bi-rads 1 neg Last colonoscopy: 2021, normal. F/U in 10 years    Exercising: Yes, bike, walk 3-4 days a week Smoker: No  Flowsheet Row Office Visit from 05/04/2024 in [facility]  PHQ-2 Total Score 0      GYN HISTORY: No sig hx  OB History  Gravida Para Term Preterm AB Living  1 1 1   2   SAB IAB Ectopic Multiple Live Births     1 2    # Outcome Date GA Lbr Len/2nd Weight Sex Type Anes PTL Lv  1A Term      CS-Unspec   LIV  1B Term      CS-Unspec   LIV   Past Medical History:  Diagnosis Date   Allergic cough 12/10/2017   Anxiety     Dysmenorrhea    History of pre-eclampsia    History of pulmonary edema    Mass of left thigh 01/08/2019   Viral respiratory illness 12/10/2017   Past Surgical History:  Procedure Laterality Date   BREAST BIOPSY Right    CESAREAN SECTION     COLPOSCOPY  1990   NEG    LIPOMA EXCISION     inner leg    Current Outpatient Medications on File Prior to Visit  Medication Sig Dispense Refill   tretinoin (RETIN-A) 0.1 % cream Apply topically at bedtime. 45 g 0   No current facility-administered medications on file prior to visit.   Social History   Socioeconomic History   Marital status: Married    Spouse name: Not on file   Number of children: Not on file   Years of education: Not on file   Highest education level: Not on file  Occupational History   Not on file  Tobacco Use   Smoking status: Never   Smokeless tobacco: Never  Vaping Use   Vaping status: Never Used  Substance and Sexual Activity   Alcohol use: Yes    Alcohol/week: 0.0 - 1.0 standard drinks of alcohol   Drug use: Never   Sexual activity: Yes    Partners: Male    Birth control/protection: Other-see comments    Comment: husband  had vasectomy   Other Topics Concern   Not on file  Social History Narrative   Not on file   Social Drivers of Health   Financial Resource Strain: Not on file  Food Insecurity: Not on file  Transportation Needs: Not on file  Physical Activity: Not on file  Stress: Not on file  Social Connections: Not on file  Intimate Partner Violence: Not on file   Family History  Problem Relation Age of Onset   Uterine cancer Paternal Grandmother    Ovarian cysts Mother        Mucinous cysadenoma ovary    Allergies  Allergen Reactions   Penicillins Hives and Rash   Bee Pollen    Nsaids Other (See Comments)    GI upset     PE Today's Vitals   05/04/24 0755  BP: 100/62  Pulse: 75  Temp: 98.3 F (36.8 C)  TempSrc: Oral  SpO2: 99%  Weight: 133 lb (60.3 kg)  Height: 5' 7 (1.702  m)   Body mass index is 20.83 kg/m.  Physical Exam Vitals reviewed. Exam conducted with a chaperone present.  Constitutional:      General: She is not in acute distress.    Appearance: Normal appearance.  HENT:     Head: Normocephalic and atraumatic.     Nose: Nose normal.  Eyes:     Extraocular Movements: Extraocular movements intact.     Conjunctiva/sclera: Conjunctivae normal.  Neck:     Thyroid : No thyroid  mass, thyromegaly or thyroid  tenderness.  Pulmonary:     Effort: Pulmonary effort is normal.  Chest:     Chest wall: No mass or tenderness.  Breasts:    Right: Normal. No swelling, mass, nipple discharge, skin change or tenderness.     Left: Normal. No swelling, mass, nipple discharge, skin change or tenderness.  Abdominal:     General: There is no distension.     Palpations: Abdomen is soft.     Tenderness: There is no abdominal tenderness.  Genitourinary:    General: Normal vulva.     Exam position: Lithotomy position.     Urethra: No prolapse.     Vagina: Normal. No vaginal discharge or bleeding.     Cervix: Normal. No lesion.     Uterus: Normal. Not enlarged and not tender.      Adnexa: Right adnexa normal and left adnexa normal.  Musculoskeletal:        General: Normal range of motion.     Cervical back: Normal range of motion.  Lymphadenopathy:     Upper Body:     Right upper body: No axillary adenopathy.     Left upper body: No axillary adenopathy.     Lower Body: No right inguinal adenopathy. No left inguinal adenopathy.  Skin:    General: Skin is warm and dry.  Neurological:     General: No focal deficit present.     Mental Status: She is alert.  Psychiatric:        Mood and Affect: Mood normal.        Behavior: Behavior normal.       Assessment and Plan:        Well woman exam with routine gynecological exam Assessment & Plan: Cervical cancer screening performed according to ASCCP guidelines. Encouraged annual mammogram  screening Colonoscopy UTD DXA N/A Labs and immunizations with her primary Encouraged safe sexual practices as indicated Encouraged healthy lifestyle practices with diet and exercise For patients under 50-70yo, I  recommend 1200mg  calcium daily and 600IU of vitamin D  daily.    Hormone replacement therapy (HRT) -     Estradiol ; Take 1 tablet (0.5 mg total) by mouth daily.  Dispense: 90 tablet; Refill: 3 -     Progesterone ; Take 1 capsule (100 mg total) by mouth at bedtime.  Dispense: 90 capsule; Refill: 3  History of anxiety -     FLUoxetine  HCl; Take 1 tablet (20 mg total) by mouth daily.  Dispense: 90 tablet; Refill: 3  Acne, unspecified acne type -     Spironolactone ; Take 1 tablet (25 mg total) by mouth daily.  Dispense: 90 tablet; Refill: 3   Vera LULLA Pa, MD

## 2024-05-04 ENCOUNTER — Encounter (INDEPENDENT_AMBULATORY_CARE_PROVIDER_SITE_OTHER): Payer: Self-pay

## 2024-05-04 ENCOUNTER — Encounter: Payer: Self-pay | Admitting: Obstetrics and Gynecology

## 2024-05-04 ENCOUNTER — Ambulatory Visit (INDEPENDENT_AMBULATORY_CARE_PROVIDER_SITE_OTHER): Admitting: Obstetrics and Gynecology

## 2024-05-04 VITALS — BP 100/62 | HR 75 | Temp 98.3°F | Ht 67.0 in | Wt 133.0 lb

## 2024-05-04 DIAGNOSIS — Z1331 Encounter for screening for depression: Secondary | ICD-10-CM

## 2024-05-04 DIAGNOSIS — Z8659 Personal history of other mental and behavioral disorders: Secondary | ICD-10-CM | POA: Diagnosis not present

## 2024-05-04 DIAGNOSIS — L709 Acne, unspecified: Secondary | ICD-10-CM | POA: Insufficient documentation

## 2024-05-04 DIAGNOSIS — Z01419 Encounter for gynecological examination (general) (routine) without abnormal findings: Secondary | ICD-10-CM | POA: Diagnosis not present

## 2024-05-04 DIAGNOSIS — Z79890 Hormone replacement therapy (postmenopausal): Secondary | ICD-10-CM

## 2024-05-04 MED ORDER — SPIRONOLACTONE 25 MG PO TABS: 25.0000 mg | ORAL_TABLET | Freq: Every day | ORAL | 3 refills | Status: AC

## 2024-05-04 MED ORDER — ESTRADIOL 0.5 MG PO TABS: 0.5000 mg | ORAL_TABLET | Freq: Every day | ORAL | 3 refills | Status: AC

## 2024-05-04 MED ORDER — FLUOXETINE HCL 20 MG PO TABS: 20.0000 mg | ORAL_TABLET | Freq: Every day | ORAL | 3 refills | Status: AC

## 2024-05-04 MED ORDER — PROGESTERONE MICRONIZED 100 MG PO CAPS: 100.0000 mg | ORAL_CAPSULE | Freq: Every day | ORAL | 3 refills | Status: AC

## 2024-05-04 NOTE — Assessment & Plan Note (Signed)
 Cervical cancer screening performed according to ASCCP guidelines. Encouraged annual mammogram screening Colonoscopy UTD DXA N/A Labs and immunizations with her primary Encouraged safe sexual practices as indicated Encouraged healthy lifestyle practices with diet and exercise For patients under 50-52yo, I recommend 1200mg  calcium daily and 600IU of vitamin D daily.

## 2024-05-04 NOTE — Patient Instructions (Signed)
 For patients under 50-52yo, I recommend 1200mg  calcium  daily and 600IU of vitamin D daily. For patients over 52yo, I recommend 1200mg  calcium  daily and 800IU of vitamin D daily.  Health Maintenance, Female Adopting a healthy lifestyle and getting preventive care are important in promoting health and wellness. Ask your health care provider about: The right schedule for you to have regular tests and exams. Things you can do on your own to prevent diseases and keep yourself healthy. What should I know about diet, weight, and exercise? Eat a healthy diet  Eat a diet that includes plenty of vegetables, fruits, low-fat dairy products, and lean protein. Do not eat a lot of foods that are high in solid fats, added sugars, or sodium. Maintain a healthy weight Body mass index (BMI) is used to identify weight problems. It estimates body fat based on height and weight. Your health care provider can help determine your BMI and help you achieve or maintain a healthy weight. Get regular exercise Get regular exercise. This is one of the most important things you can do for your health. Most adults should: Exercise for at least 150 minutes each week. The exercise should increase your heart rate and make you sweat (moderate-intensity exercise). Do strengthening exercises at least twice a week. This is in addition to the moderate-intensity exercise. Spend less time sitting. Even light physical activity can be beneficial. Watch cholesterol and blood lipids Have your blood tested for lipids and cholesterol at 52 years of age, then have this test every 5 years. Have your cholesterol levels checked more often if: Your lipid or cholesterol levels are high. You are older than 52 years of age. You are at high risk for heart disease. What should I know about cancer screening? Depending on your health history and family history, you may need to have cancer screening at various ages. This may include screening  for: Breast cancer. Cervical cancer. Colorectal cancer. Skin cancer. Lung cancer. What should I know about heart disease, diabetes, and high blood pressure? Blood pressure and heart disease High blood pressure causes heart disease and increases the risk of stroke. This is more likely to develop in people who have high blood pressure readings or are overweight. Have your blood pressure checked: Every 3-5 years if you are 25-57 years of age. Every year if you are 24 years old or older. Diabetes Have regular diabetes screenings. This checks your fasting blood sugar level. Have the screening done: Once every three years after age 62 if you are at a normal weight and have a low risk for diabetes. More often and at a younger age if you are overweight or have a high risk for diabetes. What should I know about preventing infection? Hepatitis B If you have a higher risk for hepatitis B, you should be screened for this virus. Talk with your health care provider to find out if you are at risk for hepatitis B infection. Hepatitis C Testing is recommended for: Everyone born from [birth year] through 1964. Anyone with known risk factors for hepatitis C. Sexually transmitted infections (STIs) Get screened for STIs, including gonorrhea and chlamydia, if: You are sexually active and are younger than 52 years of age. You are older than 52 years of age and your health care provider tells you that you are at risk for this type of infection. Your sexual activity has changed since you were last screened, and you are at increased risk for chlamydia or gonorrhea. Ask your health care provider if  you are at risk. Ask your health care provider about whether you are at high risk for HIV. Your health care provider may recommend a prescription medicine to help prevent HIV infection. If you choose to take medicine to prevent HIV, you should first get tested for HIV. You should then be tested every 3 months for as long as you  are taking the medicine. Osteoporosis and menopause Osteoporosis is a disease in which the bones lose minerals and strength with aging. This can result in bone fractures. If you are 72 years old or older, or if you are at risk for osteoporosis and fractures, ask your health care provider if you should: Be screened for bone loss. Take a calcium  or vitamin D supplement to lower your risk of fractures. Be given hormone replacement therapy (HRT) to treat symptoms of menopause. Follow these instructions at home: Alcohol use Do not drink alcohol if: Your health care provider tells you not to drink. You are pregnant, may be pregnant, or are planning to become pregnant. If you drink alcohol: Limit how much you have to: 0-1 drink a day. Know how much alcohol is in your drink. In the [place] one drink equals one 12 oz bottle of beer (355 mL), one 5 oz glass of wine (148 mL), or one 1 oz glass of hard liquor (44 mL). Lifestyle Do not use any products that contain nicotine or tobacco. These products include cigarettes, chewing tobacco, and vaping devices, such as e-cigarettes. If you need help quitting, ask your health care provider. Do not use street drugs. Do not share needles. Ask your health care provider for help if you need support or information about quitting drugs. General instructions Schedule regular health, dental, and eye exams. Stay current with your vaccines. Tell your health care provider if: You often feel depressed. You have ever been abused or do not feel safe at home. Summary Adopting a healthy lifestyle and getting preventive care are important in promoting health and wellness. Follow your health care provider's instructions about healthy diet, exercising, and getting tested or screened for diseases. Follow your health care provider's instructions on monitoring your cholesterol and blood pressure. This information is not intended to replace advice given to you by your health  care provider. Make sure you discuss any questions you have with your health care provider. Document Revised: 01/16/2021 Document Reviewed: 01/16/2021 Elsevier Patient Education  2024 [employer].

## 2024-06-25 ENCOUNTER — Institutional Professional Consult (permissible substitution) (INDEPENDENT_AMBULATORY_CARE_PROVIDER_SITE_OTHER): Admitting: Otolaryngology

## 2024-07-01 ENCOUNTER — Telehealth (INDEPENDENT_AMBULATORY_CARE_PROVIDER_SITE_OTHER): Payer: Self-pay

## 2024-07-01 NOTE — Telephone Encounter (Signed)
 Left vm to r/s 07/16/24 appointment

## 2024-07-03 ENCOUNTER — Encounter (INDEPENDENT_AMBULATORY_CARE_PROVIDER_SITE_OTHER): Payer: Self-pay

## 2024-07-15 ENCOUNTER — Telehealth (INDEPENDENT_AMBULATORY_CARE_PROVIDER_SITE_OTHER): Payer: Self-pay

## 2024-07-15 ENCOUNTER — Encounter (INDEPENDENT_AMBULATORY_CARE_PROVIDER_SITE_OTHER): Payer: Self-pay

## 2024-07-15 NOTE — Telephone Encounter (Signed)
 Left vm and sent my chart msg changing the appointment time and provider or to call back to reschedule.

## 2024-07-16 ENCOUNTER — Institutional Professional Consult (permissible substitution) (INDEPENDENT_AMBULATORY_CARE_PROVIDER_SITE_OTHER)

## 2024-07-16 ENCOUNTER — Institutional Professional Consult (permissible substitution) (INDEPENDENT_AMBULATORY_CARE_PROVIDER_SITE_OTHER): Admitting: Otolaryngology

## 2025-05-06 ENCOUNTER — Ambulatory Visit: Admitting: Obstetrics and Gynecology
